# Patient Record
Sex: Male | Born: 2005 | Race: White | Hispanic: No | Smoking: Never smoker
Health system: Southern US, Community
[De-identification: ages and names within clinical notes are randomized; demographics above are authoritative.]

## PROBLEM LIST (undated history)

## (undated) DIAGNOSIS — R519 Headache, unspecified: Secondary | ICD-10-CM

## (undated) DIAGNOSIS — R51 Headache: Secondary | ICD-10-CM

## (undated) DIAGNOSIS — F909 Attention-deficit hyperactivity disorder, unspecified type: Secondary | ICD-10-CM

## (undated) DIAGNOSIS — F952 Tourette's disorder: Secondary | ICD-10-CM

## (undated) DIAGNOSIS — Z915 Personal history of self-harm: Secondary | ICD-10-CM

## (undated) HISTORY — PX: NO PAST SURGERIES: SHX2092

---
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
## 2023-01-02 ENCOUNTER — Telehealth: Payer: Self-pay | Admitting: Adult Health

## 2023-01-02 NOTE — Telephone Encounter (Signed)
Sheila lvm to inform office on directions received for the Pa. See telephone encounter 5/31 concerning PA conversation.  Contact # [phone]

## 2023-01-02 NOTE — Telephone Encounter (Signed)
Mom said insurance is asking for a letter of medical necessity. She said she brought the letter she received with meds noted he had tried in the past about 2 weeks ago. I'm think this is related to Auvelity. I know PA was denied once.

## 2023-01-03 ENCOUNTER — Telehealth: Payer: Self-pay

## 2023-01-03 DIAGNOSIS — F3341 Major depressive disorder, recurrent, in partial remission: Secondary | ICD-10-CM

## 2023-01-03 NOTE — Telephone Encounter (Signed)
The pt's PA was denied but he can still get Auvelity as long as they are using the co-pay card.

## 2023-01-03 NOTE — Telephone Encounter (Signed)
Noted  

## 2023-01-03 NOTE — Telephone Encounter (Signed)
Pt's prior authorization for Auvelity was denied twice by [facility], but he still has access to the medication as long as Mom is using the co-pay card.   Denial is stating he has to try preferred drugs. 3 in a class with 3 or more alternatives, 2 in a class with 2 alternatives or 1 in a class with only 1 alternative.  Preferred drugs are: Bupropion XL, Citalopram, desvenlafaxine xr, duloxetine, escitalopram, fluoxetine, paroxetine, sertraline, venlafaxine, Trintellix.   Contacted pt's pharmacy and pt picked up his Rx for $10.00 for one month. That was just with insurance. Pharmacist reports Mom has not brought in a co-pay savings card that she can see and asked if she could bring it in to run it. Informed her I would remind Mom to take in the savings card.   I contacted Mom, Velna Hatchet and explained the savings card, I pulled It up on line and gave her the number to text to get her digital card. Instructed her to call me back with any issues.

## 2023-01-06 NOTE — Telephone Encounter (Signed)
Pt's mom LVM @ 10:06a.  She said she's been working with the pharmacy and still having issues getting the Auvelity, even with the saving card. They need the PA sent for it to show the other meds he has tried and provide medical necessity.  The Insurance phone number is [phone].  She says they have samples.  Next appt 6/20

## 2023-01-07 NOTE — Telephone Encounter (Signed)
Noted PA has been denied but pt still has access to medication using a savings card with Auvelity. Mom is notified.

## 2023-01-16 NOTE — Telephone Encounter (Signed)
Please see the multiple messages. If I need to do anything please let me know.

## 2023-01-16 NOTE — Telephone Encounter (Signed)
Pt's mom called  @11 :22a.  She is NOT on DPR.  System is note to get DPR signed for her at next appt on 6/20.  She is asking if Almira Coaster can write a letter of medical necessity versus the PA for the Auvelity.  The insurance company told her to call [phone] for quicker response.

## 2023-01-17 NOTE — Telephone Encounter (Signed)
Per pharmaceutical rep after discussion yesterday, the pharmacy is still not using the correct coding for pt to get medication with the override. Even though pt has received 2 denials on Auvelity, medication is still accessible per savings card. I have contacted pharmacy twice already. Rx's are recommended to be sent to either [facility] or [facility]. Originally Rx sent to [facility] but Mom wanted transferred to their [employer].   If pharmacies not changed or correct override put in at his pharmacy then provider will need to try to submit an appeal and a letter for medical necessity. Although pt has tried prior medications he still does not meet requirements per his insurance provider.

## 2023-01-17 NOTE — Telephone Encounter (Signed)
Let's see if mom will switch pharmacies.

## 2023-01-18 MED ORDER — AUVELITY 45-105 MG PO TBCR: 1.0000 | EXTENDED_RELEASE_TABLET | Freq: Two times a day (BID) | ORAL | 2 refills | Status: DC

## 2023-01-18 NOTE — Telephone Encounter (Signed)
Thank you. Hopefully this will fix the issue

## 2023-01-18 NOTE — Telephone Encounter (Signed)
Mom agreeable to send Rx to [facility] or [facility].

## 2023-01-18 NOTE — Telephone Encounter (Signed)
Rx sent to [facility].

## 2023-01-19 ENCOUNTER — Ambulatory Visit (INDEPENDENT_AMBULATORY_CARE_PROVIDER_SITE_OTHER): Payer: Commercial Managed Care - PPO | Admitting: Adult Health

## 2023-01-19 ENCOUNTER — Encounter: Payer: Self-pay | Admitting: Adult Health

## 2023-01-19 DIAGNOSIS — G4700 Insomnia, unspecified: Secondary | ICD-10-CM

## 2023-01-19 DIAGNOSIS — F3341 Major depressive disorder, recurrent, in partial remission: Secondary | ICD-10-CM

## 2023-01-19 DIAGNOSIS — F411 Generalized anxiety disorder: Secondary | ICD-10-CM

## 2023-01-19 DIAGNOSIS — F902 Attention-deficit hyperactivity disorder, combined type: Secondary | ICD-10-CM

## 2023-01-19 DIAGNOSIS — F22 Delusional disorders: Secondary | ICD-10-CM | POA: Diagnosis not present

## 2023-01-19 MED ORDER — PROPRANOLOL HCL 10 MG PO TABS: 10.0000 mg | ORAL_TABLET | Freq: Three times a day (TID) | ORAL | 2 refills | Status: DC

## 2023-01-19 NOTE — Progress Notes (Signed)
Paul Bowers [id number] [birth year] 16 y.o.  Subjective:   Patient ID:  Paul Bowers is a 17 y.o. (DOB 06/23/2006) male.  Chief Complaint: No chief complaint on file.   HPI Paul Bowers presents to the office today for follow-up of ADHD, GAD, insomnia, paranoia, and Tourette's.  Describes mood today as "ok". Pleasant. Mood symptoms - reports decreased depression - "a normal amount". Reports anxiety - more s in public - feels like he is spiraling. Denies irritability. Reports worry, rumination, and over thinking. Reports obsessive thoughts, not acts. Reports concerns about the thoughts he's having. Mood has improved. Stating "I'm feel better overall". Feels like current medications are helpful. Improved interest and motivation. Taking medications as prescribed.  Energy levels stable. Active, does not have a regular exercise routine.  Enjoys some usual interests and activities. Single. Not dating. Lives with mother and father - has 4 siblings - 2 french bull dogs - [place] and [place]. Spending time with family.  Appetite adequate. Weight stable - 150 to 155 pounds - 68". Sleep has improved. Averages 8 to 9 hours. Focus and concentration stable. Completing tasks. Managing aspects of household. Plans to return to school in August - [employer] or [employer] in August. 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  Reports AH or VH - radio chatter - comes and goes.  Reports paranoia - seeing a "few" black Cadillacs following him, but things are better.  Denies self harm. Denies recent substance use.     PHQ2-9    Flowsheet Row Office Visit from 01/25/2019 in [facility]  PHQ-2 Total Score 1  PHQ-9 Total Score 4        Review of Systems:  Review of Systems  Musculoskeletal:  Negative for gait problem.  Neurological:  Negative for tremors.  Psychiatric/Behavioral:         Please refer to HPI    Medications: I have reviewed the patient's current  medications.  Current Outpatient Medications  Medication Sig Dispense Refill   propranolol (INDERAL) 10 MG tablet Take 1 tablet (10 mg total) by mouth 3 (three) times daily. 90 tablet 2   ARIPiprazole (ABILIFY) 10 MG tablet Take 1 tablet (10 mg total) by mouth daily. 30 tablet 2   Dextromethorphan-buPROPion ER (AUVELITY) 45-105 MG TBCR Take 1 tablet by mouth 2 (two) times daily. 60 tablet 2   gabapentin (NEURONTIN) 100 MG capsule Take 100 mg by mouth 3 (three) times daily as needed.     guanFACINE (INTUNIV) 1 MG TB24 ER tablet Take 1 tablet (1 mg total) by mouth daily. 30 tablet 2   No current facility-administered medications for this visit.    Medication Side Effects: None  Allergies: No Known Allergies  Past Medical History:  Diagnosis Date   ADHD    H/O self-harm 10/07/2016   Headache    Tourette's     Past Medical History, Surgical history, Social history, and Family history were reviewed and updated as appropriate.   Please see review of systems for further details on the patient's review from today.   Objective:   Physical Exam:  There were no vitals taken for this visit.  Physical Exam Constitutional:      General: He is not in acute distress. Musculoskeletal:        General: No deformity.  Neurological:     Mental Status: He is alert and oriented to person, place, and time.     Coordination: Coordination normal.  Psychiatric:  Attention and Perception: Attention and perception normal. He does not perceive auditory or visual hallucinations.        Mood and Affect: Mood normal. Mood is not anxious or depressed. Affect is not labile, blunt, angry or inappropriate.        Speech: Speech normal.        Behavior: Behavior normal.        Thought Content: Thought content normal. Thought content is not paranoid or delusional. Thought content does not include homicidal or suicidal ideation. Thought content does not include homicidal or suicidal plan.         Cognition and Memory: Cognition and memory normal.        Judgment: Judgment normal.     Comments: Insight intact     Lab Review:     Component Value Date/Time   NA 136 05/29/2017 1507   K 3.6 05/29/2017 1507   CL 100 (L) 05/29/2017 1507   CO2 25 05/29/2017 1507   GLUCOSE 100 (H) 05/29/2017 1507   BUN 16 05/29/2017 1507   CREATININE 0.78 05/29/2017 1507   CALCIUM 9.7 05/29/2017 1507   PROT 8.7 (H) 05/29/2017 1507   ALBUMIN 4.9 05/29/2017 1507   AST 60 (H) 05/29/2017 1507   ALT 77 (H) 05/29/2017 1507   ALKPHOS 228 05/29/2017 1507   BILITOT 0.4 05/29/2017 1507   GFRNONAA NOT CALCULATED 05/29/2017 1507   GFRAA NOT CALCULATED 05/29/2017 1507       Component Value Date/Time   WBC 8.8 05/29/2017 1507   RBC 5.27 05/29/2017 1507   HGB 14.6 05/29/2017 1507   HCT 43.5 05/29/2017 1507   PLT 274 05/29/2017 1507   MCV 82.5 05/29/2017 1507   MCH 27.6 05/29/2017 1507   MCHC 33.5 05/29/2017 1507   RDW 12.2 05/29/2017 1507   LYMPHSABS 2.3 06/28/2016 1240   MONOABS 0.6 06/28/2016 1240   EOSABS 0.0 06/28/2016 1240   BASOSABS 0.0 06/28/2016 1240    No results found for: "POCLITH", "LITHIUM"   No results found for: "PHENYTOIN", "PHENOBARB", "VALPROATE", "CBMZ"   .res Assessment: Plan:    Plan:  PDMP reviewed  Abilify 10mg  daily Trazadone 50mg  at hs  Gabapentin 100mg  - 1 to 2 times a day Guanfacine ER 1mg  at hs Auvelity 75mg  BID   Add Propranolol 10mg  TID  RTC 4 weeks.  135/74/89  Seeing therapist - Marisa Cyphers - [facility].  Patient advised to contact office with any questions, adverse effects, or acute worsening in signs and symptoms.  Discussed potential metabolic side effects associated with atypical antipsychotics, as well as potential risk for movement side effects. Advised pt to contact office if movement side effects occur.    Diagnoses and all orders for this visit:  Generalized anxiety disorder -     propranolol (INDERAL) 10 MG tablet; Take 1 tablet  (10 mg total) by mouth 3 (three) times daily.     Please see After Visit Summary for patient specific instructions.  No future appointments.  No orders of the defined types were placed in this encounter.   -------------------------------

## 2023-01-29 ENCOUNTER — Other Ambulatory Visit: Payer: Self-pay | Admitting: Adult Health

## 2023-01-29 DIAGNOSIS — F902 Attention-deficit hyperactivity disorder, combined type: Secondary | ICD-10-CM

## 2023-02-16 ENCOUNTER — Ambulatory Visit (INDEPENDENT_AMBULATORY_CARE_PROVIDER_SITE_OTHER): Payer: Self-pay | Admitting: Adult Health

## 2023-02-16 DIAGNOSIS — Z0389 Encounter for observation for other suspected diseases and conditions ruled out: Secondary | ICD-10-CM

## 2023-02-16 NOTE — Progress Notes (Signed)
Patient no show appointment. ? ?

## 2023-02-16 NOTE — Progress Notes (Deleted)
Paul Bowers [id number] May 09, 2006 17 y.o.  Subjective:   Patient ID:  Paul Bowers is a 17 y.o. (DOB [birth year]) male.  Chief Complaint: No chief complaint on file.   HPI Paul Bowers presents to the office today for follow-up of ***   PHQ2-9    Flowsheet Row Office Visit from 01/25/2019 in [facility]  PHQ-2 Total Score 1  PHQ-9 Total Score 4        Review of Systems:  Review of Systems  Medications: {medication reviewed/display:3041432}  Current Outpatient Medications  Medication Sig Dispense Refill   guanFACINE (INTUNIV) 1 MG TB24 ER tablet TAKE 1 TABLET(1 MG) BY MOUTH DAILY 30 tablet 0   ARIPiprazole (ABILIFY) 10 MG tablet Take 1 tablet (10 mg total) by mouth daily. 30 tablet 2   Dextromethorphan-buPROPion ER (AUVELITY) 45-105 MG TBCR Take 1 tablet by mouth 2 (two) times daily. 60 tablet 2   gabapentin (NEURONTIN) 100 MG capsule Take 100 mg by mouth 3 (three) times daily as needed.     propranolol (INDERAL) 10 MG tablet Take 1 tablet (10 mg total) by mouth 3 (three) times daily. 90 tablet 2   No current facility-administered medications for this visit.    Medication Side Effects: {Medication Side Effects (Optional):21014029}  Allergies: No Known Allergies  Past Medical History:  Diagnosis Date   ADHD    H/O self-harm 10/07/2016   Headache    Tourette's     Past Medical History, Surgical history, Social history, and Family history were reviewed and updated as appropriate.   Please see review of systems for further details on the patient's review from today.   Objective:   Physical Exam:  There were no vitals taken for this visit.  Physical Exam  Lab Review:     Component Value Date/Time   NA 136 05/29/2017 1507   K 3.6 05/29/2017 1507   CL 100 (L) 05/29/2017 1507   CO2 25 05/29/2017 1507   GLUCOSE 100 (H) 05/29/2017 1507   BUN 16 05/29/2017 1507   CREATININE 0.78 05/29/2017 1507   CALCIUM 9.7  05/29/2017 1507   PROT 8.7 (H) 05/29/2017 1507   ALBUMIN 4.9 05/29/2017 1507   AST 60 (H) 05/29/2017 1507   ALT 77 (H) 05/29/2017 1507   ALKPHOS 228 05/29/2017 1507   BILITOT 0.4 05/29/2017 1507   GFRNONAA NOT CALCULATED 05/29/2017 1507   GFRAA NOT CALCULATED 05/29/2017 1507       Component Value Date/Time   WBC 8.8 05/29/2017 1507   RBC 5.27 05/29/2017 1507   HGB 14.6 05/29/2017 1507   HCT 43.5 05/29/2017 1507   PLT 274 05/29/2017 1507   MCV 82.5 05/29/2017 1507   MCH 27.6 05/29/2017 1507   MCHC 33.5 05/29/2017 1507   RDW 12.2 05/29/2017 1507   LYMPHSABS 2.3 06/28/2016 1240   MONOABS 0.6 06/28/2016 1240   EOSABS 0.0 06/28/2016 1240   BASOSABS 0.0 06/28/2016 1240    No results found for: "POCLITH", "LITHIUM"   No results found for: "PHENYTOIN", "PHENOBARB", "VALPROATE", "CBMZ"   .res Assessment: Plan:    There are no diagnoses linked to this encounter.   Please see After Visit Summary for patient specific instructions.  Future Appointments  Date Time Provider Department Center  02/16/2023 10:40 AM Weslynn Ke, Thereasa Solo, NP [facility] None    No orders of the defined types were placed in this encounter.   -------------------------------

## 2023-02-20 ENCOUNTER — Encounter: Payer: Self-pay | Admitting: Adult Health

## 2023-02-20 ENCOUNTER — Ambulatory Visit (INDEPENDENT_AMBULATORY_CARE_PROVIDER_SITE_OTHER): Payer: Commercial Managed Care - PPO | Admitting: Adult Health

## 2023-02-20 DIAGNOSIS — G4700 Insomnia, unspecified: Secondary | ICD-10-CM | POA: Diagnosis not present

## 2023-02-20 DIAGNOSIS — F3341 Major depressive disorder, recurrent, in partial remission: Secondary | ICD-10-CM | POA: Diagnosis not present

## 2023-02-20 DIAGNOSIS — F411 Generalized anxiety disorder: Secondary | ICD-10-CM | POA: Diagnosis not present

## 2023-02-20 DIAGNOSIS — F902 Attention-deficit hyperactivity disorder, combined type: Secondary | ICD-10-CM | POA: Diagnosis not present

## 2023-02-20 MED ORDER — AUVELITY 45-105 MG PO TBCR
1.0000 | EXTENDED_RELEASE_TABLET | Freq: Two times a day (BID) | ORAL | 2 refills | Status: AC
Start: 2023-02-20 — End: ?

## 2023-02-20 MED ORDER — TRAZODONE HCL 50 MG PO TABS
50.0000 mg | ORAL_TABLET | Freq: Every day | ORAL | 2 refills | Status: AC
Start: 2023-02-20 — End: ?

## 2023-02-20 MED ORDER — PROPRANOLOL HCL 20 MG PO TABS
20.0000 mg | ORAL_TABLET | Freq: Three times a day (TID) | ORAL | 2 refills | Status: AC
Start: 2023-02-20 — End: ?

## 2023-02-20 MED ORDER — ARIPIPRAZOLE 10 MG PO TABS
10.0000 mg | ORAL_TABLET | Freq: Every day | ORAL | 2 refills | Status: AC
Start: 2023-02-20 — End: ?

## 2023-02-20 NOTE — Progress Notes (Signed)
Paul Bowers [id number] Nov 08, 2005 17 y.o.  Subjective:   Patient ID:  KEEDAN SAMPLE is a 17 y.o. (DOB January 21, 2006) male.  Chief Complaint: No chief complaint on file.   HPI Sire Poet Willcox presents to the office today for follow-up of ADHD, GAD, insomnia, paranoia, and Tourette's.  Describes mood today as "ok". Pleasant. Mood symptoms - reports decreased depression - "a little bit". Reports anxiety - "still having some". Reports social anxiety. Denies irritability. Reports some worry, rumination, and over thinking. Reports decreased obsessive thoughts, denies acts. Reports some paranoia - "not as bad". Mood has improved. Stating "I'm feel better". Feels like current medications are helpful. Improved interest and motivation. Taking medications as prescribed.  Energy levels stable. Active, does not have a regular exercise routine.  Enjoys some usual interests and activities. Single. Not dating. Lives with mother and father - has 4 siblings - 2 boston terriers - [place] and [place]. Spending time with family.  Appetite adequate. Weight stable - 150 to 155 pounds - 68". Sleep has improved. Averages 8 to 9 hours. Focus and concentration stable. Completing tasks. Managing aspects of household. Plans to return to school in August - [employer] in August. Working 2 jobs.  Denies SI or HI.  Reports AH or VH - radio chatter - comes and goes.  Reports paranoia. Denies self harm. Denies recent substance use.   PHQ2-9    Flowsheet Row Office Visit from 01/25/2019 in [facility]  PHQ-2 Total Score 1  PHQ-9 Total Score 4        Review of Systems:  Review of Systems  Musculoskeletal:  Negative for gait problem.  Neurological:  Negative for tremors.  Psychiatric/Behavioral:         Please refer to HPI    Medications: I have reviewed the patient's current medications.  Current Outpatient Medications  Medication Sig Dispense Refill   traZODone (DESYREL) 50  MG tablet Take 1 tablet (50 mg total) by mouth at bedtime. 30 tablet 2   ARIPiprazole (ABILIFY) 10 MG tablet Take 1 tablet (10 mg total) by mouth daily. 30 tablet 2   Dextromethorphan-buPROPion ER (AUVELITY) 45-105 MG TBCR Take 1 tablet by mouth 2 (two) times daily. 60 tablet 2   propranolol (INDERAL) 20 MG tablet Take 1 tablet (20 mg total) by mouth 3 (three) times daily. 90 tablet 2   No current facility-administered medications for this visit.    Medication Side Effects: None  Allergies: No Known Allergies  Past Medical History:  Diagnosis Date   ADHD    H/O self-harm 10/07/2016   Headache    Tourette's     Past Medical History, Surgical history, Social history, and Family history were reviewed and updated as appropriate.   Please see review of systems for further details on the patient's review from today.   Objective:   Physical Exam:  There were no vitals taken for this visit.  Physical Exam Constitutional:      General: He is not in acute distress. Musculoskeletal:        General: No deformity.  Neurological:     Mental Status: He is alert and oriented to person, place, and time.     Coordination: Coordination normal.  Psychiatric:        Attention and Perception: Attention and perception normal. He does not perceive auditory or visual hallucinations.        Mood and Affect: Affect is not labile, blunt, angry or inappropriate.  Speech: Speech normal.        Behavior: Behavior normal.        Thought Content: Thought content normal. Thought content is not paranoid or delusional. Thought content does not include homicidal or suicidal ideation. Thought content does not include homicidal or suicidal plan.        Cognition and Memory: Cognition and memory normal.        Judgment: Judgment normal.     Comments: Insight intact     Lab Review:     Component Value Date/Time   NA 136 05/29/2017 1507   K 3.6 05/29/2017 1507   CL 100 (L) 05/29/2017 1507   CO2 25  05/29/2017 1507   GLUCOSE 100 (H) 05/29/2017 1507   BUN 16 05/29/2017 1507   CREATININE 0.78 05/29/2017 1507   CALCIUM 9.7 05/29/2017 1507   PROT 8.7 (H) 05/29/2017 1507   ALBUMIN 4.9 05/29/2017 1507   AST 60 (H) 05/29/2017 1507   ALT 77 (H) 05/29/2017 1507   ALKPHOS 228 05/29/2017 1507   BILITOT 0.4 05/29/2017 1507   GFRNONAA NOT CALCULATED 05/29/2017 1507   GFRAA NOT CALCULATED 05/29/2017 1507       Component Value Date/Time   WBC 8.8 05/29/2017 1507   RBC 5.27 05/29/2017 1507   HGB 14.6 05/29/2017 1507   HCT 43.5 05/29/2017 1507   PLT 274 05/29/2017 1507   MCV 82.5 05/29/2017 1507   MCH 27.6 05/29/2017 1507   MCHC 33.5 05/29/2017 1507   RDW 12.2 05/29/2017 1507   LYMPHSABS 2.3 06/28/2016 1240   MONOABS 0.6 06/28/2016 1240   EOSABS 0.0 06/28/2016 1240   BASOSABS 0.0 06/28/2016 1240    No results found for: "POCLITH", "LITHIUM"   No results found for: "PHENYTOIN", "PHENOBARB", "VALPROATE", "CBMZ"   .res Assessment: Plan:    Plan:  PDMP reviewed  Abilify 10mg  daily Trazadone 50mg  at hs  Auvelity 75mg  BID  Increase Propranolol 10mg  to 20mg  TID  D/C Guanfacine ER 1mg  at hs  RTC 4 weeks. 88/46/94  Seeing therapist - Marisa Cyphers - [facility].  Patient advised to contact office with any questions, adverse effects, or acute worsening in signs and symptoms.  Discussed potential metabolic side effects associated with atypical antipsychotics, as well as potential risk for movement side effects. Advised pt to contact office if movement side effects occur.    Diagnoses and all orders for this visit:  Insomnia, unspecified type -     traZODone (DESYREL) 50 MG tablet; Take 1 tablet (50 mg total) by mouth at bedtime. -     propranolol (INDERAL) 20 MG tablet; Take 1 tablet (20 mg total) by mouth 3 (three) times daily.  Depression, major, recurrent, in partial remission (HCC) -     ARIPiprazole (ABILIFY) 10 MG tablet; Take 1 tablet (10 mg total) by mouth daily. -      Dextromethorphan-buPROPion ER (AUVELITY) 45-105 MG TBCR; Take 1 tablet by mouth 2 (two) times daily.  Attention deficit hyperactivity disorder, combined type, moderate  Generalized anxiety disorder -     propranolol (INDERAL) 20 MG tablet; Take 1 tablet (20 mg total) by mouth 3 (three) times daily.     Please see After Visit Summary for patient specific instructions.  No future appointments.   No orders of the defined types were placed in this encounter.   -------------------------------

## 2023-02-21 ENCOUNTER — Telehealth: Payer: Self-pay | Admitting: Adult Health

## 2023-02-21 NOTE — Telephone Encounter (Signed)
Pharmacy sent Pa Request for Auvelity 45-105mg  , see CMM

## 2023-02-22 NOTE — Telephone Encounter (Signed)
disregard

## 2023-03-03 ENCOUNTER — Emergency Department (HOSPITAL_COMMUNITY)
Admission: EM | Admit: 2023-03-03 | Discharge: 2023-03-04 | Disposition: A | Discharge status: Other Acute Inpatient Hospital | Payer: Commercial Managed Care - PPO | Source: Home / Self Care | Attending: Emergency Medicine | Admitting: Emergency Medicine

## 2023-03-03 ENCOUNTER — Other Ambulatory Visit: Payer: Self-pay

## 2023-03-03 ENCOUNTER — Encounter: Payer: Self-pay | Admitting: Emergency Medicine

## 2023-03-03 DIAGNOSIS — F902 Attention-deficit hyperactivity disorder, combined type: Secondary | ICD-10-CM | POA: Insufficient documentation

## 2023-03-03 DIAGNOSIS — R799 Abnormal finding of blood chemistry, unspecified: Secondary | ICD-10-CM | POA: Insufficient documentation

## 2023-03-03 DIAGNOSIS — T484X2A Poisoning by expectorants, intentional self-harm, initial encounter: Secondary | ICD-10-CM | POA: Insufficient documentation

## 2023-03-03 DIAGNOSIS — X838XXA Intentional self-harm by other specified means, initial encounter: Secondary | ICD-10-CM | POA: Insufficient documentation

## 2023-03-03 DIAGNOSIS — F900 Attention-deficit hyperactivity disorder, predominantly inattentive type: Secondary | ICD-10-CM | POA: Diagnosis not present

## 2023-03-03 DIAGNOSIS — F332 Major depressive disorder, recurrent severe without psychotic features: Secondary | ICD-10-CM | POA: Diagnosis not present

## 2023-03-03 DIAGNOSIS — R45851 Suicidal ideations: Secondary | ICD-10-CM

## 2023-03-03 DIAGNOSIS — T50902D Poisoning by unspecified drugs, medicaments and biological substances, intentional self-harm, subsequent encounter: Secondary | ICD-10-CM | POA: Diagnosis not present

## 2023-03-03 DIAGNOSIS — F422 Mixed obsessional thoughts and acts: Secondary | ICD-10-CM | POA: Diagnosis present

## 2023-03-03 DIAGNOSIS — T50902A Poisoning by unspecified drugs, medicaments and biological substances, intentional self-harm, initial encounter: Secondary | ICD-10-CM | POA: Insufficient documentation

## 2023-03-03 DIAGNOSIS — T50904A Poisoning by unspecified drugs, medicaments and biological substances, undetermined, initial encounter: Secondary | ICD-10-CM

## 2023-03-03 LAB — URINE DRUG SCREEN, QUALITATIVE (ARMC ONLY)
Amphetamines, Ur Screen: NOT DETECTED
Barbiturates, Ur Screen: NOT DETECTED
Benzodiazepine, Ur Scrn: NOT DETECTED
Cannabinoid 50 Ng, Ur ~~LOC~~: POSITIVE — AB
Cocaine Metabolite,Ur ~~LOC~~: NOT DETECTED
MDMA (Ecstasy)Ur Screen: NOT DETECTED
Methadone Scn, Ur: NOT DETECTED
Opiate, Ur Screen: POSITIVE — AB
Phencyclidine (PCP) Ur S: NOT DETECTED
Tricyclic, Ur Screen: POSITIVE — AB

## 2023-03-03 LAB — CBC
HCT: 45 % (ref 39.0–52.0)
Hemoglobin: 15.2 g/dL (ref 13.0–17.0)
MCH: 29.5 pg (ref 26.0–34.0)
MCHC: 33.8 g/dL (ref 30.0–36.0)
MCV: 87.4 fL (ref 80.0–100.0)
Platelets: 303 10*3/uL (ref 150–400)
RBC: 5.15 MIL/uL (ref 4.22–5.81)
RDW: 11.9 % (ref 11.5–15.5)
WBC: 8.6 10*3/uL (ref 4.0–10.5)
nRBC: 0 % (ref 0.0–0.2)

## 2023-03-03 LAB — COMPREHENSIVE METABOLIC PANEL
ALT: 29 U/L (ref 0–44)
AST: 26 U/L (ref 15–41)
Albumin: 4.8 g/dL (ref 3.5–5.0)
Alkaline Phosphatase: 117 U/L (ref 38–126)
Anion gap: 11 (ref 5–15)
BUN: 10 mg/dL (ref 6–20)
CO2: 25 mmol/L (ref 22–32)
Calcium: 9.3 mg/dL (ref 8.9–10.3)
Chloride: 102 mmol/L (ref 98–111)
Creatinine, Ser: 1.17 mg/dL (ref 0.61–1.24)
GFR, Estimated: >60 mL/min (ref >60)
Glucose, Bld: 71 mg/dL (ref 70–99)
Potassium: 3.8 mmol/L (ref 3.5–5.1)
Sodium: 138 mmol/L (ref 135–145)
Total Bilirubin: 1.1 mg/dL (ref 0.3–1.2)
Total Protein: 8.2 g/dL — ABNORMAL HIGH (ref 6.5–8.1)

## 2023-03-03 LAB — SALICYLATE LEVEL: Salicylate Lvl: <7 mg/dL — ABNORMAL LOW (ref 7.0–30.0)

## 2023-03-03 LAB — CBG MONITORING, ED: Glucose-Capillary: 73 mg/dL (ref 70–99)

## 2023-03-03 LAB — ACETAMINOPHEN LEVEL: Acetaminophen (Tylenol), Serum: <10 ug/mL — ABNORMAL LOW (ref 10–30)

## 2023-03-03 LAB — ETHANOL: Alcohol, Ethyl (B): <10 mg/dL (ref <10)

## 2023-03-03 MED ORDER — SODIUM CHLORIDE 0.9 % IV BOLUS
2000.0000 mL | Freq: Once | INTRAVENOUS | Status: AC
  Administered 2023-03-03: 2000 mL via INTRAVENOUS

## 2023-03-03 NOTE — ED Notes (Signed)
IVC 

## 2023-03-03 NOTE — ED Notes (Addendum)
Patient suspected to have taken 12 mucinex and potentially 29 25 mg benadryl tabs. Benadryl bottle dispensed with 100 tabs and 29 are missing. Unknown what amount pt actually consumed. Patient placed on cardiac monitor upon arrival in room and EKG obtained. Pt noted to be responding to internal stimuli in the form of visual hallucinations. Pt notable confused and unsteady gait.   Poison control contacted and recommendations as follows:   Monitor for delirium, combativeness, urinary retention. Monitor QRS and QTC routinely and repeat EKG in 4 hours. Draw a tylenol level now and repeat in 4 hours. Administer IVF and Benzo's as needed. Minimum observation time of 6 hours on cardiac monitor.  RN spoke with [employer]

## 2023-03-03 NOTE — ED Triage Notes (Signed)
  Patient BIB parents after intentional OD and suicidal attempt around 1700 today.  Parents state patient took 14 mucinex, unknown amount of benadryl, and used his THC vape.  Patient has hx of same last October.  Patient observed by family doing odd things and empty pill bottles in bedroom.  Parents state patient may have been trying to get high instead of harm himself.

## 2023-03-04 ENCOUNTER — Encounter: Payer: Self-pay | Admitting: Psychiatry

## 2023-03-04 ENCOUNTER — Encounter: Payer: Self-pay | Admitting: Psychiatric/Mental Health

## 2023-03-04 ENCOUNTER — Inpatient Hospital Stay
Admission: EM | Admit: 2023-03-04 | Discharge: 2023-03-07 | DRG: 885 | Disposition: A | Discharge status: Home / Self Care | Payer: Commercial Managed Care - PPO | Source: Intra-hospital | Attending: Psychiatry | Admitting: Psychiatry

## 2023-03-04 ENCOUNTER — Other Ambulatory Visit: Payer: Self-pay

## 2023-03-04 DIAGNOSIS — T484X2A Poisoning by expectorants, intentional self-harm, initial encounter: Secondary | ICD-10-CM | POA: Diagnosis present

## 2023-03-04 DIAGNOSIS — F900 Attention-deficit hyperactivity disorder, predominantly inattentive type: Secondary | ICD-10-CM | POA: Diagnosis not present

## 2023-03-04 DIAGNOSIS — F332 Major depressive disorder, recurrent severe without psychotic features: Secondary | ICD-10-CM

## 2023-03-04 DIAGNOSIS — T50902A Poisoning by unspecified drugs, medicaments and biological substances, intentional self-harm, initial encounter: Secondary | ICD-10-CM | POA: Insufficient documentation

## 2023-03-04 DIAGNOSIS — T50902D Poisoning by unspecified drugs, medicaments and biological substances, intentional self-harm, subsequent encounter: Secondary | ICD-10-CM | POA: Diagnosis not present

## 2023-03-04 DIAGNOSIS — R799 Abnormal finding of blood chemistry, unspecified: Secondary | ICD-10-CM | POA: Diagnosis present

## 2023-03-04 DIAGNOSIS — F909 Attention-deficit hyperactivity disorder, unspecified type: Secondary | ICD-10-CM | POA: Diagnosis present

## 2023-03-04 DIAGNOSIS — T450X2A Poisoning by antiallergic and antiemetic drugs, intentional self-harm, initial encounter: Secondary | ICD-10-CM | POA: Diagnosis present

## 2023-03-04 DIAGNOSIS — R45851 Suicidal ideations: Secondary | ICD-10-CM | POA: Diagnosis present

## 2023-03-04 DIAGNOSIS — F902 Attention-deficit hyperactivity disorder, combined type: Secondary | ICD-10-CM | POA: Diagnosis present

## 2023-03-04 DIAGNOSIS — Z818 Family history of other mental and behavioral disorders: Secondary | ICD-10-CM

## 2023-03-04 DIAGNOSIS — F952 Tourette's disorder: Secondary | ICD-10-CM | POA: Diagnosis present

## 2023-03-04 MED ORDER — HYDROXYZINE HCL 25 MG PO TABS
25.0000 mg | ORAL_TABLET | Freq: Three times a day (TID) | ORAL | Status: DC | PRN
  Filled 2023-03-04: qty 1

## 2023-03-04 MED ORDER — ARIPIPRAZOLE 10 MG PO TABS: 10.0000 mg | ORAL_TABLET | Freq: Every day | ORAL | Status: DC

## 2023-03-04 MED ORDER — TRAZODONE HCL 50 MG PO TABS: 50.0000 mg | ORAL_TABLET | Freq: Every day | ORAL | Status: DC

## 2023-03-04 MED ORDER — ALUM & MAG HYDROXIDE-SIMETH 200-200-20 MG/5ML PO SUSP: 30.0000 mL | ORAL | Status: DC | PRN

## 2023-03-04 MED ORDER — LORAZEPAM 2 MG PO TABS: 2.0000 mg | ORAL_TABLET | Freq: Three times a day (TID) | ORAL | Status: DC | PRN

## 2023-03-04 MED ORDER — HALOPERIDOL LACTATE 5 MG/ML IJ SOLN: 5.0000 mg | Freq: Three times a day (TID) | INTRAMUSCULAR | Status: DC | PRN

## 2023-03-04 MED ORDER — PROPRANOLOL HCL 20 MG PO TABS: 20.0000 mg | ORAL_TABLET | Freq: Three times a day (TID) | ORAL | Status: DC

## 2023-03-04 MED ORDER — DIPHENHYDRAMINE HCL 50 MG/ML IJ SOLN: 50.0000 mg | Freq: Three times a day (TID) | INTRAMUSCULAR | Status: DC | PRN

## 2023-03-04 MED ORDER — TRAZODONE HCL 50 MG PO TABS
50.0000 mg | ORAL_TABLET | Freq: Every evening | ORAL | Status: DC | PRN
  Administered 2023-03-04 – 2023-03-06 (×3): 50 mg via ORAL
  Filled 2023-03-04 (×3): qty 1

## 2023-03-04 MED ORDER — DEXTROMETHORPHAN-BUPROPION ER 45-105 MG PO TBCR: 1.0000 | EXTENDED_RELEASE_TABLET | Freq: Two times a day (BID) | ORAL | Status: DC

## 2023-03-04 MED ORDER — MAGNESIUM HYDROXIDE 400 MG/5ML PO SUSP: 30.0000 mL | Freq: Every day | ORAL | Status: DC | PRN

## 2023-03-04 MED ORDER — ACETAMINOPHEN 325 MG PO TABS: 650.0000 mg | ORAL_TABLET | Freq: Four times a day (QID) | ORAL | Status: DC | PRN

## 2023-03-04 MED ORDER — LORAZEPAM 2 MG/ML IJ SOLN: 2.0000 mg | Freq: Three times a day (TID) | INTRAMUSCULAR | Status: DC | PRN

## 2023-03-04 MED ORDER — DIPHENHYDRAMINE HCL 25 MG PO CAPS: 50.0000 mg | ORAL_CAPSULE | Freq: Three times a day (TID) | ORAL | Status: DC | PRN

## 2023-03-04 MED ORDER — HALOPERIDOL 5 MG PO TABS: 5.0000 mg | ORAL_TABLET | Freq: Three times a day (TID) | ORAL | Status: DC | PRN

## 2023-03-04 NOTE — ED Notes (Signed)
Parents leaving now. Parents were provided with phone number and visitation hours for BMU. NP explained plan of care to pt and parents.

## 2023-03-04 NOTE — ED Notes (Signed)
Father of pt who is still at bedside past 15 minute visitation window is insisting that charge nurse promised that he would be called to come back for psych consult today. CN went and spoke with him. Mother will be escorted back so both parents can be present. Psych NP already assessed pt today but has agreed to come back to talk to parents and pt again. Father of pt and pt are stating that pt was never suicidal and should not be admitted inpatient. Informed them that this RN has no control over IVC status.

## 2023-03-04 NOTE — ED Notes (Signed)
Patient father provided recliner. No distress noted and patient calm. VSS. CB in reach. Will continue to monitor.

## 2023-03-04 NOTE — ED Notes (Signed)
Psych NP was at bedside with pt and parents.

## 2023-03-04 NOTE — Consult Note (Signed)
Telepsych Consultation   Reason for Consult:  Psych Evaluation Referring Physician:  Dr. Fanny Bien Location of Patient: [facility] Location of Provider: Other: Remote office   Patient Identification: Paul Bowers MRN:  [id number] Principal Diagnosis: Suicide attempt by drug ingestion [facility]) Diagnosis:  Principal Problem:   Suicide attempt by drug ingestion (HCC) Active Problems:   MDD (major depressive disorder), recurrent episode, severe (HCC)   Suicide ideation   Mixed obsessional thoughts and acts   Attention deficit hyperactivity disorder, combined type, moderate   Total Time spent with patient: 45 minutes  Subjective:  Patient BIB parents after intentional OD and suicidal attempt around 1700 today. Parents state patient took 14 mucinex, unknown amount of benadryl, and used his THC vape.    HPI:  Tele psych Assessment   Paul Bowers, 17 y.o., male patient with a history of ADHD, MDD, prior SA, and SI presents to [facility] ER with complaints of depression and an apparent suicide attempt seen via tele health by TTS and this provider; chart reviewed and consulted with Dr. Katrinka Blazing on 03/04/23.  On evaluation Paul Bowers reports that that he took  14 mucinex, and a handful of benadryl to "get high". However, per chart review, as early as   06/2016 when patient was 17 years old, patient was admitted to [facility] for posting on social media that he wanted to hang himself.  09/2016 patient was admitted to [facility] due to the following events :  "Pt's father reported, the pt was admitted to [facility], three months ago for suicidal thoughts and a suicide attempt. Pt reported, over the past year he has had 4-5 suicide attempts. Pt reported, for the past three weeks he has been cutting himself on the stomach with a razor. Pt reported, he tried to hang himself a couple of times. Pt reported, he "sort of" attempted suicide by taking 4-5 pills. Pt reported, "I wouldn't purposely moved out of the way  if a car was coming."  05/02/2022 patient presented to [facility] with an overdose of benadryl:  "When asked why he thought he was in ED pt states, "last night I had a shot of alcohol, but then my roommate came back, so I went to the parking deck and while I was there 2 police officers came to talk to me, then I guess they called an ambulance". ED nurses noted at triage concern pt had also used an unknown amount of Benadryl, however when asked by crisis team pt denies if he had used any Benadryl or other substances besides the one shot of liquor." Patient was observed overnight and discharged with his family.  Today patient presents for an overdose of benadryl and mucinex.  Patient and father downplays the intent of the overdose. Dad says, "you know these teenagers take a mixture of anything to get high".  When asking the patient questions, patient often loks over to dad for the answers.  Dad says he believes that most of his symptoms are related to his schizophrenia diagnosis.  Of note, there is no formal schizophrenia diagnosis per this provider's chart review.    Per TTS, Patient is a 17 year old male presenting to [facility] ED under IVC. Per triage note Patient BIB parents after intentional OD and suicidal attempt around 1700 today. Parents state patient took 14 mucinex, unknown amount of benadryl, and used his THC vape. Patient has hx of same last October. Patient observed by family doing odd things and empty pill bottles in bedroom.  Parents state patient may have been trying to get high instead of harm himself. During assessment patient appears alert and oriented x4, calm and cooperative. Patient reports "I took a bunch of pills, there was no intention to hurt myself, I just wanted to get high." Patient reports that he used to smoke marijuana and drank alcohol but hasn't had those substance since June. Patient reports that he took the medication due to "mental health issues, I have a diagnosis of Schizophrenia", he  reports that he was experiencing AH "they were telling me that I shouldn't tell people that I have Schizophrenia." Patient currently has a psychiatrist and a therapist with [facility] in [place] and patient has a diagnosis of Depression and ADHD but no documented Schizophrenia diagnosis. Patient reports that he is prescribed medications for his mental health that he takes. Patient denies SI/HI/VH.   Patient is presenting with his father Paul Bowers who also reports that the patient was using the medication to get high and reports that this was not attempt to hurt himself "there's research where kids are using Benadryl to get high." Father minimizes the actions that the patient took tonight throughout the assessment and reports that the patient "did worse than this in October." Father reports that he used more Benadryl in the past and that the patient was discharged to his parent's care after he was medically cleared. Father reports that the patient has had partial hospitalization within the past 6 months at a treatment facility in [place].    Recommendations: Psychiatric  Inpatient hospitalization  Past Psychiatric History: Past SA, MDD, ADHD  Risk to Self:  yes Risk to Others:  no Prior Inpatient Therapy:  yes Prior Outpatient Therapy:  yes  Past Medical History:  Past Medical History:  Diagnosis Date   ADHD    H/O self-harm 10/07/2016   Headache    Tourette's    History reviewed. No pertinent surgical history. Family History:  Family History  Problem Relation Age of Onset   Kidney disease Mother    Diabetes Mother    Depression Mother    Breast cancer Maternal Grandmother    Family Psychiatric  History: unknown Social History:  Social History   Substance and Sexual Activity  Alcohol Use No     Social History   Substance and Sexual Activity  Drug Use No    Social History   Socioeconomic History   Marital status: Single    Spouse name: Not on file   Number of  children: Not on file   Years of education: Not on file   Highest education level: Not on file  Occupational History   Not on file  Tobacco Use   Smoking status: Never   Smokeless tobacco: Never  Vaping Use   Vaping status: Never Used  Substance and Sexual Activity   Alcohol use: No   Drug use: No   Sexual activity: Never  Other Topics Concern   Not on file  Social History Narrative   Not on file   Social Determinants of Health   Financial Resource Strain: Not on file  Food Insecurity: Not on file  Transportation Needs: Not on file  Physical Activity: Not on file  Stress: Not on file  Social Connections: Not on file   Additional Social History:    Allergies:  No Known Allergies  Labs:  Results for orders placed or performed during the hospital encounter of 03/03/23 (from the past 48 hour(s))  Comprehensive metabolic panel  Status: Abnormal   Collection Time: 03/03/23  8:18 PM  Result Value Ref Range   Sodium 138 135 - 145 mmol/L   Potassium 3.8 3.5 - 5.1 mmol/L   Chloride 102 98 - 111 mmol/L   CO2 25 22 - 32 mmol/L   Glucose, Bld 71 70 - 99 mg/dL    Comment: Glucose reference range applies only to samples taken after fasting for at least 8 hours.   BUN 10 6 - 20 mg/dL   Creatinine, Ser 1.61 0.61 - 1.24 mg/dL   Calcium 9.3 8.9 - 09.6 mg/dL   Total Protein 8.2 (H) 6.5 - 8.1 g/dL   Albumin 4.8 3.5 - 5.0 g/dL   AST 26 15 - 41 U/L   ALT 29 0 - 44 U/L   Alkaline Phosphatase 117 38 - 126 U/L   Total Bilirubin 1.1 0.3 - 1.2 mg/dL   GFR, Estimated >04 >54 mL/min    Comment: (NOTE) Calculated using the CKD-EPI Creatinine Equation (2021)    Anion gap 11 5 - 15    Comment: Performed at [facility], [address] [place], [place] [address]  Ethanol     Status: None   Collection Time: 03/03/23  8:18 PM  Result Value Ref Range   Alcohol, Ethyl (B) <10 <10 mg/dL    Comment: (NOTE) Lowest detectable limit for serum alcohol is 10 mg/dL.  For medical  purposes only. Performed at [facility], [address] [place], [place] [address]   Salicylate level     Status: Abnormal   Collection Time: 03/03/23  8:18 PM  Result Value Ref Range   Salicylate Lvl <7.0 (L) 7.0 - 30.0 mg/dL    Comment: Performed at [facility], [address] [place], [place] [address]  Acetaminophen level     Status: Abnormal   Collection Time: 03/03/23  8:18 PM  Result Value Ref Range   Acetaminophen (Tylenol), Serum <10 (L) 10 - 30 ug/mL    Comment: (NOTE) Therapeutic concentrations vary significantly. A range of 10-30 ug/mL  may be an effective concentration for many patients. However, some  are best treated at concentrations outside of this range. Acetaminophen concentrations >150 ug/mL at 4 hours after ingestion  and >50 ug/mL at 12 hours after ingestion are often associated with  toxic reactions.  Performed at [facility], [address] [place], [place] [address]   cbc     Status: None   Collection Time: 03/03/23  8:18 PM  Result Value Ref Range   WBC 8.6 4.0 - 10.5 K/uL   RBC 5.15 4.22 - 5.81 MIL/uL   Hemoglobin 15.2 13.0 - 17.0 g/dL   HCT 57.8 46.9 - 62.9 %   MCV 87.4 80.0 - 100.0 fL   MCH 29.5 26.0 - 34.0 pg   MCHC 33.8 30.0 - 36.0 g/dL   RDW 52.8 41.3 - 24.4 %   Platelets 303 150 - 400 K/uL   nRBC 0.0 0.0 - 0.2 %    Comment: Performed at [facility], [address] [place], [place] [address]  CBG monitoring, ED     Status: None   Collection Time: 03/03/23  8:55 PM  Result Value Ref Range   Glucose-Capillary 73 70 - 99 mg/dL    Comment: Glucose reference range applies only to samples taken after fasting for at least 8 hours.  Urine Drug Screen, Qualitative     Status: Abnormal   Collection Time: 03/03/23 10:20 PM  Result  Value Ref Range   Tricyclic, Ur Screen POSITIVE (A) NONE DETECTED   Amphetamines, Ur Screen NONE DETECTED NONE DETECTED   MDMA (Ecstasy)Ur Screen NONE DETECTED NONE DETECTED    Cocaine Metabolite,Ur [place] NONE DETECTED NONE DETECTED   Opiate, Ur Screen POSITIVE (A) NONE DETECTED   Phencyclidine (PCP) Ur S NONE DETECTED NONE DETECTED   Cannabinoid 50 Ng, Ur [place] POSITIVE (A) NONE DETECTED   Barbiturates, Ur Screen NONE DETECTED NONE DETECTED   Benzodiazepine, Ur Scrn NONE DETECTED NONE DETECTED   Methadone Scn, Ur NONE DETECTED NONE DETECTED    Comment: (NOTE) Tricyclics + metabolites, urine    Cutoff 1000 ng/mL Amphetamines + metabolites, urine  Cutoff 1000 ng/mL MDMA (Ecstasy), urine              Cutoff 500 ng/mL Cocaine Metabolite, urine          Cutoff 300 ng/mL Opiate + metabolites, urine        Cutoff 300 ng/mL Phencyclidine (PCP), urine         Cutoff 25 ng/mL Cannabinoid, urine                 Cutoff 50 ng/mL Barbiturates + metabolites, urine  Cutoff 200 ng/mL Benzodiazepine, urine              Cutoff 200 ng/mL Methadone, urine                   Cutoff 300 ng/mL  The urine drug screen provides only a preliminary, unconfirmed analytical test result and should not be used for non-medical purposes. Clinical consideration and professional judgment should be applied to any positive drug screen result due to possible interfering substances. A more specific alternate chemical method must be used in order to obtain a confirmed analytical result. Gas chromatography / mass spectrometry (GC/MS) is the preferred confirm atory method. Performed at [facility], [address] [place], [place] [address]   Acetaminophen level     Status: Abnormal   Collection Time: 03/04/23  2:33 AM  Result Value Ref Range   Acetaminophen (Tylenol), Serum <10 (L) 10 - 30 ug/mL    Comment: (NOTE) Therapeutic concentrations vary significantly. A range of 10-30 ug/mL  may be an effective concentration for many patients. However, some  are best treated at concentrations outside of this range. Acetaminophen concentrations >150 ug/mL at 4 hours after ingestion  and >50  ug/mL at 12 hours after ingestion are often associated with  toxic reactions.  Performed at [facility], [address] [place], [place] [address]   Salicylate level     Status: Abnormal   Collection Time: 03/04/23  2:33 AM  Result Value Ref Range   Salicylate Lvl <7.0 (L) 7.0 - 30.0 mg/dL    Comment: Performed at [facility], [address] [place], [place] [address]    Medications:  No current facility-administered medications for this encounter.   Current Outpatient Medications  Medication Sig Dispense Refill   ARIPiprazole (ABILIFY) 10 MG tablet Take 1 tablet (10 mg total) by mouth daily. 30 tablet 2   Dextromethorphan-buPROPion ER (AUVELITY) 45-105 MG TBCR Take 1 tablet by mouth 2 (two) times daily. 60 tablet 2   propranolol (INDERAL) 20 MG tablet Take 1 tablet (20 mg total) by mouth 3 (three) times daily. 90 tablet 2   traZODone (DESYREL) 50 MG tablet Take 1 tablet (50 mg total) by mouth at bedtime. 30 tablet 2    Musculoskeletal: Strength & Muscle  Tone: within normal limits Gait & Station: normal Patient leans: N/A          Psychiatric Specialty Exam:  Presentation  General Appearance: Appropriate for Environment; Casual  Eye Contact:Fair  Speech:Clear and Coherent  Speech Volume:Decreased  Handedness:Right   Mood and Affect  Mood:Anxious; Depressed; Dysphoric; Worthless  Affect:Depressed; Flat; Restricted   Thought Process  Thought Processes:Coherent  Descriptions of Associations:Intact  Orientation:Full (Time, Place and Person)  Thought Content:WDL  History of Schizophrenia/Schizoaffective disorder:No  Duration of Psychotic Symptoms:Less than six months  Hallucinations:Hallucinations: Auditory Description of Auditory Hallucinations: drug induced  Ideas of Reference:None  Suicidal Thoughts:Suicidal Thoughts: Yes, Active SI Active Intent and/or Plan: With Plan; With Means to Carry Out; With Intent  Homicidal  Thoughts:Homicidal Thoughts: No   Sensorium  Memory:Immediate Fair; Remote Fair  Judgment:Impaired  Insight:Lacking   Executive Functions  Concentration:Fair  Attention Span:Fair  Recall:Fair  Fund of Knowledge:Fair  Language:Fair   Psychomotor Activity  Psychomotor Activity:Psychomotor Activity: Normal   Assets  Assets:Communication Skills; Financial Resources/Insurance; Housing; Physical Health; Vocational/Educational   Sleep  Sleep:Sleep: Fair    Physical Exam: Physical Exam Vitals and nursing note reviewed.    ROS Blood pressure 129/80, pulse 74, temperature 98.5 F (36.9 C), temperature source Oral, resp. rate 15, height 5\' 7"  (1.702 m), weight 70.3 kg, SpO2 100%. Body mass index is 24.28 kg/m.  Treatment Plan Summary: Daily contact with patient to assess and evaluate symptoms and progress in treatment, Medication management, and Plan  Paul Bowers was admitted to [facility] under the service of No att. providers found for Suicide attempt by drug ingestion [facility]), crisis management, and stabilization. Routine labs ordered, which include Lab Orders         Comprehensive metabolic panel         Ethanol         Salicylate level         Acetaminophen level         cbc         Urine Drug Screen, Qualitative         Acetaminophen level         Salicylate level         CBG monitoring, ED    Medication Management: Medications started PRN Meds.  Will defer to inpatient provider to determine medication regime Will maintain observation checks every 15 minutes for safety. Psychosocial education regarding relapse prevention and self-care; social and communication  Social work will consult with family for collateral information and discuss discharge and follow up plan.  Disposition: Recommend psychiatric Inpatient admission when medically cleared. Supportive therapy provided about ongoing stressors. Discussed crisis plan, support  from social network, calling 911, coming to the Emergency Department, and calling Suicide Hotline.  This service was provided via telemedicine using a 2-way, interactive audio and video technology.     Jearld Lesch, NP 03/04/2023 4:48 AM

## 2023-03-04 NOTE — ED Notes (Signed)
Admission issues clarified, pt graduated high school last year, pt and parents were prepared for adult BMU admission and spoke with NP Penn twice today about this, pt will be escorted downstairs shortly.

## 2023-03-04 NOTE — ED Notes (Signed)
Called dietary at this time to confirm that a breakfast tray is in the way for this pt.

## 2023-03-04 NOTE — ED Notes (Signed)
IVC/pending inpatient psych admission 

## 2023-03-04 NOTE — Consult Note (Signed)
  Paul Bowers is a 17 yo male presenting under IVC following OTC drug overdose. Patient is noted sitting on his bed, eating his meal tray. Patient reports a history of depression with suicidal ideation, delusional thought, and hallucinations. Patient described previous delusional thought as feeling like he was in the [facility], stating that his mother and father were really not them. Patient states that he has experienced hallucinations consisting of hearing his mother calling him and seeing his father in his office, when he really was not present. Patient reports that he has not experienced delusional thought or hallucinations in a few days. Patient and his father also reports a family history of depression and suicide attempt with his mother.   Patient states that he also has a history of using Benadryl because "I liked the feeling", stating he started using Benadryl while enrolled at [employer]. Patient reports later having to call his parents and inform them of failing grades. Patient reports having to un-enroll from [employer]. Patient confirmed that delusional thought, hallucinations, and depression with suicidal ideations initiated prior to misuse of OTC medications. Patient reports that his initial depression followed him being bullied in school, prior to college years.  Today, patient reports never having SI with a plan, which does not coincide with his medical record. Patient reports not remembering this information, but his mother confirmed that SI with plan to hang himself did occur. Patient stated my memory is blocked from things that happened long ago as to why his provided history did not coincide with his medical record. Patient stated that he was not attempting suicide prior to presenting to the ED this time. His father, who is also present, stated that his son was "just trying to get high". Patient also reported that he had a supply of drugs for quite a while and was trying  to refrain from OTC medication misuse but "it won". Patient stated he gave in to cravings and used, attempting "to get high". Patient acknowledged how his poor judgment and actions could have been detrimental to his life. Patient reports that he "blacked out and then I got back up and took some more".   Patient's father reports "we brought him because we did not know how much he took and he was confused.Marland KitchenMarland KitchenI did not think we would end up here". Patient's father stated "now if he did this in October, I would agree with him being admitted". Patient's father shared that Barbara's symptoms have improved during the past months. He reports that patient is established with therapy on an outpatient basis. Patient's father voices concern that Paul Bowers will miss registration at [employer] to begin Fall classes if he is admitted inpatient. Patient and his family made aware of the previous provider's evaluation and recommendation for inpatient status. Patient and his family also made aware of this writer's agreement with the previous provider's recommendation for inpatient status to ensure patient's safety.  Permission to discuss confidential information was granted by [employer] prior to engaging with parents. Parents voiced concern that patient has not received his home medications. Home medications restarted.

## 2023-03-04 NOTE — ED Provider Notes (Incomplete)
[facility] Provider Note    Event Date/Time   First MD Initiated Contact with Patient 03/03/23 2026     (approximate)   History   Ingestion and Suicide Attempt   HPI  Paul Bowers is a 17 y.o. male history of substance abuse  Parents got a message from one of their children patient was not acting normally.  They got home and found that he had consumed about 12 Mucinex tablets which they brought with confirmed to contain guanfacine  no acetaminophen.  Also creatinine unknown number of Benadryl.  He has been hallucinating.  He has a history of similar substance abuse noted to begin and also diagnosis of schizophrenia occurring in the last year.  Patient himself unable to give circumstances of the ingestion.  Parents report they think this was done to get high, he has not made any statements or gestures to suggest he is suicidal     Physical Exam   Triage Vital Signs: ED Triage Vitals [03/03/23 2012]  Encounter Vitals Group     BP (!) 162/88     Systolic BP Percentile      Diastolic BP Percentile      Pulse Rate (!) 109     Resp 20     Temp 98.6 F (37 C)     Temp Source Oral     SpO2 97 %     Weight 155 lb (70.3 kg)     Height 5\' 7"  (1.702 m)     Head Circumference      Peak Flow      Pain Score 0     Pain Loc      Pain Education      Exclude from Growth Chart     Most recent vital signs: Vitals:   03/03/23 2300 03/03/23 2335  BP: (!) 145/90 (!) 148/96  Pulse: 93 (!) 103  Resp: 11 14  Temp:  100.3 F (37.9 C)  SpO2: 99% 100%     General: Awake, no distress.  He is somewhat hallucinating, reports he thinks he is in [place] mention something about needing to go to the store to buy dog food but also recognizes in the hospital He denies being suicidal, reports he has done this in the past to get high CV:  Good peripheral perfusion.  Very mild tachycardia This membranes slightly dry No notable nystagmus.  No  sweating. Resp:  Normal effort.  Clear bilateral Abd:  No distention.  Soft nontender Other:  No noted injuries.  Normocephalic atraumatic   ED Results / Procedures / Treatments   Labs (all labs ordered are listed, but only abnormal results are displayed) Labs Reviewed  COMPREHENSIVE METABOLIC PANEL - Abnormal; Notable for the following components:      Result Value   Total Protein 8.2 (*)    All other components within normal limits  SALICYLATE LEVEL - Abnormal; Notable for the following components:   Salicylate Lvl <7.0 (*)    All other components within normal limits  ACETAMINOPHEN LEVEL - Abnormal; Notable for the following components:   Acetaminophen (Tylenol), Serum <10 (*)    All other components within normal limits  URINE DRUG SCREEN, QUALITATIVE ([facility] ONLY) - Abnormal; Notable for the following components:   Tricyclic, Ur Screen POSITIVE (*)    Opiate, Ur Screen POSITIVE (*)    Cannabinoid 50 Ng, Ur [place] POSITIVE (*)    All other components within normal limits  ETHANOL  CBC  ACETAMINOPHEN LEVEL  SALICYLATE LEVEL  CBG MONITORING, ED     EKG  ***   RADIOLOGY *** {USE THE WORD "INTERPRETED"!! You MUST document your own interpretation of imaging, as well as the fact that you reviewed the radiologist's report!:1}   PROCEDURES:  Critical Care performed: {CriticalCareYesNo:19197::"Yes, see critical care procedure note(s)","No"}  Procedures   MEDICATIONS ORDERED IN ED: Medications  sodium chloride 0.9 % bolus 2,000 mL (0 mLs Intravenous Stopped 03/03/23 2316)     IMPRESSION / MDM / ASSESSMENT AND PLAN / ED COURSE  I reviewed the triage vital signs and the nursing notes.                              Differential diagnosis includes, but is not limited to, ***  Patient's presentation is most consistent with {EM COPA:27473}  *** {If the patient is on the monitor, remove the brackets and asterisks on the sentence below and remember to document it as a  Procedure as well. Otherwise delete the sentence below:1} {**The patient is on the cardiac monitor to evaluate for evidence of arrhythmia and/or significant heart rate changes.**} {Remember to include, when applicable, any/all of the following data: independent review of imaging independent review of labs (comment specifically on pertinent positives and negatives) review of specific prior hospitalizations, PCP/specialist notes, etc. discuss meds given and prescribed document any discussion with consultants (including hospitalists) any clinical decision tools you used and why (PECARN, NEXUS, etc.) did you consider admitting the patient? document social determinants of health affecting patient's care (homelessness, inability to follow up in a timely fashion, etc) document any pre-existing conditions increasing risk on current visit (e.g. diabetes and HTN increasing danger of high-risk chest pain/ACS) describes what meds you gave (especially parenteral) and why any other interventions?:1}     FINAL CLINICAL IMPRESSION(S) / ED DIAGNOSES   Final diagnoses:  None     Rx / DC Orders   ED Discharge Orders     None        Note:  This document was prepared using Dragon voice recognition software and may include unintentional dictation errors.

## 2023-03-04 NOTE — Group Note (Signed)
Date:  03/04/2023 Time:  10:57 PM  Group Topic/Focus:  Dimensions of Wellness:   The focus of this group is to introduce the topic of wellness and discuss the role each dimension of wellness plays in total health.    Participation Level:  Active  Participation Quality:  Appropriate  Affect:  Appropriate  Cognitive:  Alert and Appropriate  Insight: Good  Engagement in Group:  Developing/Improving  Modes of Intervention:  Discussion, Education, Problem-solving, Rapport Building, Socialization, and support   Additional Comments:     Kaylla Cobos 03/04/2023, 10:57 PM

## 2023-03-04 NOTE — Plan of Care (Signed)

## 2023-03-04 NOTE — ED Provider Notes (Signed)
[facility] Provider Note    Event Date/Time   First MD Initiated Contact with Patient 03/03/23 2026     (approximate)   History   Ingestion and Suicide Attempt   HPI  Paul Bowers is a 17 y.o. male history of substance abuse  Parents got a message from one of their children patient was not acting normally.  They got home and found that he had consumed about 12 Mucinex tablets which they brought with confirmed to contain guanfacine  no acetaminophen.  Also creatinine unknown number of Benadryl.  He has been hallucinating.  He has a history of similar substance abuse noted to begin and also diagnosis of schizophrenia occurring in the last year.  Patient himself unable to give circumstances of the ingestion.  Parents report they think this was done to get high, he has not made any statements or gestures to suggest he is suicidal     Physical Exam   Triage Vital Signs: ED Triage Vitals [03/03/23 2012]  Encounter Vitals Group     BP (!) 162/88     Systolic BP Percentile      Diastolic BP Percentile      Pulse Rate (!) 109     Resp 20     Temp 98.6 F (37 C)     Temp Source Oral     SpO2 97 %     Weight 155 lb (70.3 kg)     Height 5\' 7"  (1.702 m)     Head Circumference      Peak Flow      Pain Score 0     Pain Loc      Pain Education      Exclude from Growth Chart     Most recent vital signs: Vitals:   03/03/23 2300 03/03/23 2335  BP: (!) 145/90 (!) 148/96  Pulse: 93 (!) 103  Resp: 11 14  Temp:  100.3 F (37.9 C)  SpO2: 99% 100%     General: Awake, no distress.  He is somewhat hallucinating, reports he thinks he is in [place] mention something about needing to go to the store to buy dog food but also recognizes in the hospital He denies being suicidal, reports he has done this in the past to get high CV:  Good peripheral perfusion.  Very mild tachycardia This membranes slightly dry No notable nystagmus.  No  sweating. Resp:  Normal effort.  Clear bilateral Abd:  No distention.  Soft nontender Other:  No noted injuries.  Normocephalic atraumatic   ED Results / Procedures / Treatments   Labs (all labs ordered are listed, but only abnormal results are displayed) Labs Reviewed  COMPREHENSIVE METABOLIC PANEL - Abnormal; Notable for the following components:      Result Value   Total Protein 8.2 (*)    All other components within normal limits  SALICYLATE LEVEL - Abnormal; Notable for the following components:   Salicylate Lvl <7.0 (*)    All other components within normal limits  ACETAMINOPHEN LEVEL - Abnormal; Notable for the following components:   Acetaminophen (Tylenol), Serum <10 (*)    All other components within normal limits  URINE DRUG SCREEN, QUALITATIVE ([facility] ONLY) - Abnormal; Notable for the following components:   Tricyclic, Ur Screen POSITIVE (*)    Opiate, Ur Screen POSITIVE (*)    Cannabinoid 50 Ng, Ur [place] POSITIVE (*)    All other components within normal limits  ETHANOL  CBC  ACETAMINOPHEN LEVEL  SALICYLATE LEVEL  CBG MONITORING, ED     EKG  Entered by me at 2020 heart rate 105 QRS 90 QTc 440 Normal sinus rhythm, no evidence of acute ischemia.  QTc normal some terminal elevation of aVR noted, but QRS complex normal width   RADIOLOGY     PROCEDURES:  Critical Care performed: Yes, see critical care procedure note(s) CRITICAL CARE Performed by: Sharyn Creamer   Total critical care time: 35 minutes  Critical care time was exclusive of separately billable procedures and treating other patients.  Critical care was necessary to treat or prevent imminent or life-threatening deterioration.  Critical care was time spent personally by me on the following activities: development of treatment plan with patient and/or surrogate as well as nursing, discussions with consultants, evaluation of patient's response to treatment, examination of patient, obtaining history from  patient or surrogate, ordering and performing treatments and interventions, ordering and review of laboratory studies, ordering and review of radiographic studies, pulse oximetry and re-evaluation of patient's condition.  Procedures   MEDICATIONS ORDERED IN ED: Medications  sodium chloride 0.9 % bolus 2,000 mL (0 mLs Intravenous Stopped 03/03/23 2316)     IMPRESSION / MDM / ASSESSMENT AND PLAN / ED COURSE  I reviewed the triage vital signs and the nursing notes.                              Differential diagnosis includes, but is not limited to, overdose of unknown intent, suspect recreational but given the patient's hallucinations, confusion will place under IVC for his own safety as well as he is lack of capacity.  Symptoms and ingestion most consistent with anticholinergic type toxidrome with dry mouth, mild tachycardia, hallucinations.  No evidence of seizure activity.  Hemodynamics are relatively stable, we will hydrate and observe closely.  Initial labs reassuring, normal acetaminophen and salicylate levels.  Awaiting repeat studies of EKG and salicylate levels to follow at approximately 2 AM.    Patient's presentation is most consistent with acute presentation with potential threat to life or bodily function.   The patient is on the cardiac monitor to evaluate for evidence of arrhythmia and/or significant heart rate changes.  Poison control consulted   Poison control contacted and recommendations as follows:    Monitor for delirium, combativeness, urinary retention. Monitor QRS and QTC routinely and repeat EKG in 4 hours. Draw a tylenol level now and repeat in 4 hours. Administer IVF and Benzo's as needed. Minimum observation time of 6 hours on cardiac monitor.  Psychiatry consult requested.  Placed under IVC for safety.  Ongoing care including ongoing medical treatment care and observation assigned to my partner Dr. Katrinka Blazing at 1210am   FINAL CLINICAL IMPRESSION(S) / ED DIAGNOSES    Final diagnoses:  Overdose, undetermined intent, initial encounter     Rx / DC Orders   ED Discharge Orders     None        Note:  This document was prepared using Dragon voice recognition software and may include unintentional dictation errors.   Sharyn Creamer, MD 03/04/23 [phone]

## 2023-03-04 NOTE — ED Notes (Signed)
Patient is IVC pending psych inpatient admit

## 2023-03-04 NOTE — Progress Notes (Signed)
Pt is pleasant and cooperative with admission process Pt denies any suicidal ideation or homicidal ideation,Pt stated that he was taking  the meds he took to get high.His parents agree with this statement.His parents do not agree with his involuntary status.Pt stated he will return home upon discharge and he wants to register for technical college.

## 2023-03-04 NOTE — Group Note (Unsigned)
Date:  03/04/2023 Time:  8:53 PM  Group Topic/Focus:  Dimensions of Wellness:   The focus of this group is to introduce the topic of wellness and discuss the role each dimension of wellness plays in total health.     Participation Level:  {BHH PARTICIPATION NWGNF:62130}  Participation Quality:  {BHH PARTICIPATION QUALITY:22265}  Affect:  {BHH AFFECT:22266}  Cognitive:  {BHH COGNITIVE:22267}  Insight: {BHH Insight2:20797}  Engagement in Group:  {BHH ENGAGEMENT IN QMVHQ:46962}  Modes of Intervention:  {BHH MODES OF INTERVENTION:22269}  Additional Comments:  ***  Jalal Rauch 03/04/2023, 8:53 PM

## 2023-03-04 NOTE — ED Notes (Signed)
Mother of pt now at bedside as well as father, awaiting psych NP to come explain plan to them.

## 2023-03-04 NOTE — ED Notes (Signed)
Patient accepted to [facility] unit 301 and can arrive at 1400.  Attending: Dr Marlou Porch.  Dx: MDD.  Called [facility] to give report at [phone]. Spoke with Johnny Bridge, RN. They will call back on this RN ascom number [phone].

## 2023-03-04 NOTE — BH Assessment (Signed)
Patient is still under medical observation.  Psych  will follow-up with patient once patient is medically cleared.

## 2023-03-04 NOTE — ED Notes (Signed)
Father of pt here to visit pt. Father and pt made aware of visitation policy. Pt was dressed out this morning and moved to room 20. Parents had been sitting with pt in room although pt was already under IVC.

## 2023-03-04 NOTE — ED Notes (Addendum)
This RN went to the lobby and spoke with patients mother and father. Patient father had questions as to why the patient was still being held here. Per Father, they were told this morning that patient would be re-evaluated by psych and would possibly be going home. Parents also questions why they were asked to leave when they have been with him the whole time. This charge nurse explained that the parents were asked to leave because patient is now medically cleared and under psych hold and moved to behavioral area. Parents expressed concern that some things the patient said during the psych evaluation was take out of context and that patient was misunderstood. Parents are insistent that patient did this to get high and not in an attempted to self-harm. Parent requested that when re-evaluation is done that they are allowed to be present. Family was informed that we would allow this.   When this RN returned to the patient care area, I was informed by [facility] psych's plan was to admit pt to inpatient unit, not to re-evaluate pt.

## 2023-03-04 NOTE — Group Note (Unsigned)
Date:  03/04/2023 Time:  9:57 PM  Group Topic/Focus:  Dimensions of Wellness:   The focus of this group is to introduce the topic of wellness and discuss the role each dimension of wellness plays in total health.     Participation Level:  {BHH PARTICIPATION JXBJY:78295}  Participation Quality:  {BHH PARTICIPATION QUALITY:22265}  Affect:  {BHH AFFECT:22266}  Cognitive:  {BHH COGNITIVE:22267}  Insight: {BHH Insight2:20797}  Engagement in Group:  {BHH ENGAGEMENT IN AOZHY:86578}  Modes of Intervention:  {BHH MODES OF INTERVENTION:22269}  Additional Comments:  ***  Quinn Bartling 03/04/2023, 9:57 PM

## 2023-03-04 NOTE — BH Assessment (Signed)
Comprehensive Clinical Assessment (CCA) Note  03/04/2023 Paul Bowers [id number]  Chief Complaint: Patient is a 17 year old male presenting to [facility] ED under IVC. Per triage note Patient BIB parents after intentional OD and suicidal attempt around 1700 today. Parents state patient took 14 mucinex, unknown amount of benadryl, and used his THC vape. Patient has hx of same last October. Patient observed by family doing odd things and empty pill bottles in bedroom. Parents state patient may have been trying to get high instead of harm himself. During assessment patient appears alert and oriented x4, calm and cooperative. Patient reports "I took a bunch of pills, there was no intention to hurt myself, I just wanted to get high." Patient reports that he used to smoke marijuana and drank alcohol but hasn't had those substance since June. 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 Patient reports that he is prescribed medications for his mental health that he takes. Patient denies SI/HI/VH.  Patient is presenting with his father [name] who also reports that the patient was using the medication to get high and reports that this was not attempt to hurt himself "there's research where kids are using Benadryl to get high." Father minimizes the actions that the patient took tonight throughout the assessment and reports that the patient "did worse than this in October." Father reports that he used more Benadryl in the past and that the patient was discharged to his parent's care after he was medically cleared. Father reports that the patient has had  partial hospitalization within the past 6 months at a treatment facility in [place].   Per Psyc NP Lerry Liner patient is recommended for Inpatient Chief Complaint  Patient presents with   Ingestion   Suicide Attempt   Visit Diagnosis: Major Depressive Disorder, recurrent episode, severe    CCA Screening, Triage and Referral (STR)  Patient Reported Information How did you hear about [place]? Family/Friend  Referral name: No data recorded Referral phone number: No data recorded  Whom do you see for routine medical problems? No data recorded Practice/Facility Name: No data recorded Practice/Facility Phone Number: No data recorded Name of Contact: No data recorded Contact Number: No data recorded Contact Fax Number: No data recorded Prescriber Name: No data recorded Prescriber Address (if known): No data recorded  What Is the Reason for Your Visit/Call Today? Patient BIB parents after intentional OD and suicidal attempt around 1700 today.  Parents state patient took 14 mucinex, unknown amount of benadryl, and used his THC vape.  Patient has hx of same last October.  Patient observed by family doing odd things and empty pill bottles in bedroom.  Parents state patient may have been trying to get high instead of harm himself.  How Long Has This Been Causing You Problems? > than 6 months  What Do You Feel Would Help You the Most Today? No data recorded  Have You Recently Been in Any Inpatient Treatment (Hospital/Detox/Crisis Center/28-Day Program)? No data recorded Name/Location of Program/Hospital:No data recorded How Long Were You There? No data recorded When Were You Discharged? No data recorded  Have You Ever Received Services From [facility] Before? No data  recorded Who Do You See at [facility]? No data recorded  Have You Recently Had Any Thoughts About Hurting Yourself? No  Are You Planning to Commit Suicide/Harm Yourself At This time? No   Have you Recently Had Thoughts  About Hurting Someone Karolee Ohs? No  Explanation: No data recorded  Have You Used Any Alcohol or Drugs in the Past 24 Hours? No  How Long Ago Did You Use Drugs or Alcohol? No data recorded What Did You Use and How Much? No data recorded  Do You Currently Have a Therapist/Psychiatrist? Yes  Name of Therapist/Psychiatrist: [facility]   Have You Been Recently Discharged From Any Office Practice or Programs? No  Explanation of Discharge From Practice/Program: No data recorded    CCA Screening Triage Referral Assessment Type of Contact: Face-to-Face  Is this Initial or Reassessment? No data recorded Date Telepsych consult ordered in [facility]:  No data recorded Time Telepsych consult ordered in [facility]:  No data recorded  Patient Reported Information Reviewed? No data recorded Patient Left Without Being Seen? No data recorded Reason for Not Completing Assessment: No data recorded  Collateral Involvement: No data recorded  Does Patient Have a Court Appointed Legal Guardian? No data recorded Name and Contact of Legal Guardian: No data recorded If Minor and Not Living with Parent(s), Who has Custody? No data recorded Is CPS involved or ever been involved? Never  Is APS involved or ever been involved? Never   Patient Determined To Be At Risk for Harm To Self or Others Based on Review of Patient Reported Information or Presenting Complaint? Yes, for Self-Harm  Method: No data recorded Availability of Means: No data recorded Intent: No data recorded Notification Required: No data recorded Additional Information for Danger to Others Potential: No data recorded Additional Comments for Danger to Others Potential: No data recorded Are There Guns or Other Weapons in Your Home? No  Types of Guns/Weapons: No data recorded Are These Weapons Safely Secured?                            No data recorded Who Could Verify You Are Able To Have These Secured: No data recorded Do You Have any  Outstanding Charges, Pending Court Dates, Parole/Probation? No data recorded Contacted To Inform of Risk of Harm To Self or Others: No data recorded  Location of Assessment: [facility] ED   Does Patient Present under Involuntary Commitment? Yes  IVC Papers Initial File Date: No data recorded  [place] of Residence: [place]   Patient Currently Receiving the Following Services: Medication Management; Individual Therapy   Determination of Need: Emergent (2 hours)   Options For Referral: No data recorded    CCA Biopsychosocial Intake/Chief Complaint:  No data recorded Current Symptoms/Problems: No data recorded  Patient Reported Schizophrenia/Schizoaffective Diagnosis in Past: No   Strengths: Patient is able to communicate his needs; has a supportive family  Preferences: No data recorded Abilities: No data recorded  Type of Services Patient Feels are Needed: No data recorded  Initial Clinical Notes/Concerns: No data recorded  Mental Health Symptoms Depression:   Change in energy/activity; Difficulty Concentrating; Fatigue; Worthlessness   Duration of Depressive symptoms:  Greater than two weeks   Mania:   None   Anxiety:    Difficulty concentrating; Fatigue; Restlessness; Worrying   Psychosis:   Hallucinations   Duration of Psychotic symptoms:  Less than six months   Trauma:   None   Obsessions:  None   Compulsions:   None   Inattention:   None   Hyperactivity/Impulsivity:   None   Oppositional/Defiant Behaviors:   None   Emotional Irregularity:   None   Other Mood/Personality Symptoms:  No data recorded   Mental Status Exam Appearance and self-care  Stature:   Average   Weight:   Average weight   Clothing:   Casual   Grooming:   Normal   Cosmetic use:   None   Posture/gait:   Normal   Motor activity:   Not Remarkable   Sensorium  Attention:   Normal   Concentration:   Normal   Orientation:   X5   Recall/memory:    Normal   Affect and Mood  Affect:   Appropriate   Mood:   Other (Comment)   Relating  Eye contact:   Normal   Facial expression:   Responsive   Attitude toward examiner:   Cooperative   Thought and Language  Speech flow:  Clear and Coherent   Thought content:   Appropriate to Mood and Circumstances   Preoccupation:   None   Hallucinations:   None   Organization:  No data recorded  [employer] of Knowledge:   Fair   Intelligence:   Average   Abstraction:   Normal   Judgement:   Poor   Reality Testing:   Adequate   Insight:   Lacking; Poor; Denial   Decision Making:   Impulsive   Social Functioning  Social Maturity:   Impulsive   Social Judgement:   Heedless   Stress  Stressors:   Other (Comment)   Coping Ability:   Exhausted   Skill Deficits:   None   Supports:   Family; Friends/Service system     Religion: Religion/Spirituality Are You A Religious Person?: No  Leisure/Recreation: Leisure / Recreation Do You Have Hobbies?: No  Exercise/Diet: Exercise/Diet Do You Exercise?: No Have You Gained or Lost A Significant Amount of Weight in the Past Six Months?: No Do You Follow a Special Diet?: No Do You Have Any Trouble Sleeping?: No   CCA Employment/Education Employment/Work Situation: Employment / Work Situation Employment Situation: Employed Work Stressors: None reported Patient's Job has Been Impacted by Current Illness: No Has Patient ever Been in Equities trader?: No  Education: Education Is Patient Currently Attending School?: No Did Theme park manager?: No Did You Have An Individualized Education Program (IIEP): No Did You Have Any Difficulty At [employer]?: No Patient's Education Has Been Impacted by Current Illness: No   CCA Family/Childhood History Family and Relationship History: Family history Marital status: Single Does patient have children?: No  Childhood History:  Childhood  History By whom was/is the patient raised?: Both parents Did patient suffer any verbal/emotional/physical/sexual abuse as a child?: No Did patient suffer from severe childhood neglect?: No Has patient ever been sexually abused/assaulted/raped as an adolescent or adult?: No Was the patient ever a victim of a crime or a disaster?: No Witnessed domestic violence?: No Has patient been affected by domestic violence as an adult?: No  Child/Adolescent Assessment:     CCA Substance Use Alcohol/Drug Use: Alcohol / Drug Use Pain Medications: see mar Prescriptions: see mar Over the Counter: see mar History of alcohol / drug use?: Yes Substance #1 Name of Substance 1: marijuana Substance #2 Name of Substance 2: alcohol                     ASAM's:  Six  Dimensions of Multidimensional Assessment  Dimension 1:  Acute Intoxication and/or Withdrawal Potential:      Dimension 2:  Biomedical Conditions and Complications:      Dimension 3:  Emotional, Behavioral, or Cognitive Conditions and Complications:     Dimension 4:  Readiness to Change:     Dimension 5:  Relapse, Continued use, or Continued Problem Potential:     Dimension 6:  Recovery/Living Environment:     ASAM Severity Score:    ASAM Recommended Level of Treatment:     Substance use Disorder (SUD)    Recommendations for Services/Supports/Treatments:    DSM5 Diagnoses: Patient Active Problem List   Diagnosis Date Noted   Suicide attempt by drug ingestion (HCC) 03/04/2023   Tourette disorder 06/19/2018   Mixed obsessional thoughts and acts 06/19/2018   Attention deficit hyperactivity disorder, combined type, moderate 06/19/2018   Depression, major, recurrent, in partial remission (HCC) 10/07/2016   Suicide ideation 06/29/2016    Patient Centered Plan: Patient is on the following Treatment Plan(s):  Depression   Referrals to Alternative Service(s): Referred to Alternative Service(s):   Place:   Date:   Time:     Referred to Alternative Service(s):   Place:   Date:   Time:    Referred to Alternative Service(s):   Place:   Date:   Time:    Referred to Alternative Service(s):   Place:   Date:   Time:      @[facility] @  [employer], LCAS-A

## 2023-03-04 NOTE — ED Notes (Signed)
This tech collected urine

## 2023-03-04 NOTE — ED Notes (Signed)
This RN in room to speak to patient father. Pts father was informed that I was not aware that psych NP was here seeing patient and that we had called to see if she was able to come back and speak with the parents. Psych NP agreed to come speak with patient and parents together. Front desk staff was informed OK for mother to come back while NP is speaking to patient.

## 2023-03-04 NOTE — ED Notes (Signed)
Pt received his breakfast tray at this time.

## 2023-03-04 NOTE — ED Notes (Signed)
Pt belongings:  Blue underwear Earrings

## 2023-03-04 NOTE — Tx Team (Signed)
Initial Treatment Plan 03/04/2023 5:49 PM Paul Bowers [id number]    PATIENT STRESSORS: Substance abuse     PATIENT STRENGTHS: Ability for insight  Active sense of humor  Average or above average intelligence  Capable of independent living  Communication skills  General fund of knowledge  Physical Health  Supportive family/friends    PATIENT IDENTIFIED PROBLEMS:   OTC medication abuse                   DISCHARGE CRITERIA:  Ability to meet basic life and health needs Adequate post-discharge living arrangements Improved stabilization in mood, thinking, and/or behavior  PRELIMINARY DISCHARGE PLAN: Outpatient therapy Return to previous living arrangement Return to previous work or school arrangements  PATIENT/FAMILY INVOLVEMENT: This treatment plan has been presented to and reviewed with the patient, Paul Bowers .  The patient and family have been given the opportunity to ask questions and make suggestions.  Bonnita Hollow, RN 03/04/2023, 5:49 PM

## 2023-03-04 NOTE — ED Notes (Signed)
Staff at [facility] now stating that admission to adolescent unit would be better. Charge nurse made aware.

## 2023-03-04 NOTE — ED Provider Notes (Signed)
-----------------------------------------   7:12 AM on 03/04/2023 -----------------------------------------  The patient was placed in psychiatric observation due to the need to provide a safe environment for the patient while obtaining psychiatric consultation and evaluation, as well as ongoing medical and medication management to treat the patient's condition.  The patient has been placed under full IVC at this time.  He has received his initial psychiatric evaluation, but given that he has returned to baseline and does not appear to be a danger to himself or others, he will be reassessed this morning by the psychiatry team to determine if inpatient treatment is still recommended.  The patient's father is with him and is very comfortable taking him home.   Loleta Rose, MD 03/04/23 [phone]

## 2023-03-05 DIAGNOSIS — F332 Major depressive disorder, recurrent severe without psychotic features: Secondary | ICD-10-CM

## 2023-03-05 MED ORDER — PROPRANOLOL HCL 20 MG PO TABS
20.0000 mg | ORAL_TABLET | Freq: Three times a day (TID) | ORAL | Status: DC
  Administered 2023-03-05: 20 mg via ORAL
  Filled 2023-03-05: qty 1

## 2023-03-05 MED ORDER — PROPRANOLOL HCL 20 MG PO TABS: 20.0000 mg | ORAL_TABLET | Freq: Three times a day (TID) | ORAL | Status: DC | PRN

## 2023-03-05 MED ORDER — ARIPIPRAZOLE 10 MG PO TABS
10.0000 mg | ORAL_TABLET | Freq: Every day | ORAL | Status: DC
  Administered 2023-03-05 – 2023-03-07 (×3): 10 mg via ORAL
  Filled 2023-03-05 (×3): qty 1

## 2023-03-05 MED ORDER — DEXTROMETHORPHAN-BUPROPION ER 45-105 MG PO TBCR
1.0000 | EXTENDED_RELEASE_TABLET | Freq: Two times a day (BID) | ORAL | Status: DC
  Administered 2023-03-05 – 2023-03-07 (×4): 1 via ORAL
  Filled 2023-03-05 (×4): qty 1

## 2023-03-05 MED ORDER — DEXTROMETHORPHAN-BUPROPION ER 45-105 MG PO TBCR: 1.0000 | EXTENDED_RELEASE_TABLET | Freq: Two times a day (BID) | ORAL | Status: DC

## 2023-03-05 MED ORDER — PROPRANOLOL HCL 20 MG PO TABS: 20.0000 mg | ORAL_TABLET | Freq: Three times a day (TID) | ORAL | Status: DC

## 2023-03-05 MED ORDER — PROPRANOLOL HCL 20 MG PO TABS
20.0000 mg | ORAL_TABLET | Freq: Three times a day (TID) | ORAL | Status: DC | PRN
  Administered 2023-03-05 – 2023-03-06 (×2): 20 mg via ORAL
  Filled 2023-03-05 (×2): qty 1

## 2023-03-05 NOTE — Plan of Care (Signed)

## 2023-03-05 NOTE — Group Note (Signed)
[facility] LCSW Group Therapy Note   Group Date: 03/05/2023 Start Time: 1300 End Time: 1400   Type of Therapy/Topic:  Group Therapy:  Balance in Life  Participation Level:  Active   Description of Group:    This group will address the concept of balance and how it feels and looks when one is unbalanced. Patients will be encouraged to process areas in their lives that are out of balance, and identify reasons for remaining unbalanced. Facilitators will guide patients utilizing problem- solving interventions to address and correct the stressor making their life unbalanced. Understanding and applying boundaries will be explored and addressed for obtaining  and maintaining a balanced life. Patients will be encouraged to explore ways to assertively make their unbalanced needs known to significant others in their lives, using other group members and facilitator for support and feedback.  Therapeutic Goals: Patient will identify two or more emotions or situations they have that consume much of in their lives. Patient will identify signs/triggers that life has become out of balance:  Patient will identify two ways to set boundaries in order to achieve balance in their lives:  Patient will demonstrate ability to communicate their needs through discussion and/or role plays  Summary of Patient Progress:   The patient attended Group.  Patient proved open to input from peers and feedback from [facility]. Patient demonstrated insight into the subject matter, was respectful of peers, and participated throughout the entire session. The patient participated during the icebreaker. The patient stated that vaping is something he can do without but its hard.      Marshell Levan, LCSW

## 2023-03-05 NOTE — [facility] Suicide Risk Assessment (Signed)
[facility] INPATIENT:  Family/Significant Other Suicide Prevention Education  Suicide Prevention Education:  Patient Refusal for Family/Significant Other Suicide Prevention Education: The patient Paul Bowers has refused to provide written consent for family/significant other to be provided Family/Significant Other Suicide Prevention Education during admission and/or prior to discharge.  Physician notified.  The patient declined contacting a family member or significant other for suicide prevention education. The LCSWA provided the patient with a Suicide Prevention Education pamphlet.  Marshell Levan 03/05/2023, 12:47 PM

## 2023-03-05 NOTE — BHH Counselor (Signed)
Adult Comprehensive Assessment  Patient ID: TESLA BOCHICCHIO, male   DOB: 2005/08/25, 17 y.o.   MRN: [id number]  Information Source: Information source: Patient  Current Stressors:  Patient states their primary concerns and needs for treatment are:: The patient stated that he was not trying to kill himself, it was an overdose, and he was brought here because he was incoherent. Patient states their goals for this hospitilization and ongoing recovery are:: The patient stated that he wants to be listened to and be apart of his treatment plan. Educational / Learning stressors: The patient stated that he is supopose to register for classess tomorrow and that he needs to be discharged. Employment / Job issues: The patient stated that he has missed 2 sifts from work and is afriad of being fired. Family Relationships: The patient stated none. Financial / Lack of resources (include bankruptcy): The patient stated none. Housing / Lack of housing: The patient stated none. Physical health (include injuries & life threatening diseases): The patient stated none. Social relationships: The patient stated none. Substance abuse: The patient stated that he used Benadryl and Mucinex to get high. Bereavement / Loss: The patient stated none.  Living/Environment/Situation:  Living Arrangements: Parent Living conditions (as described by patient or guardian): The patient stated that it was standard. Who else lives in the home?: The patient stated his parents and 4 siblings How long has patient lived in current situation?: The patient stated his whole life. What is atmosphere in current home: Loving  Family History:  Marital status: Single Are you sexually active?: No Does patient have children?: No  Childhood History:  By whom was/is the patient raised?: Both parents Description of patient's relationship with caregiver when they were a child: The patient stated that his parents were loving and  caring. Patient's description of current relationship with people who raised him/her: The patient stated that his parents are even more loving and caring. How were you disciplined when you got in trouble as a child/adolescent?: The patient stated timeouts, no going outside, and taking things away. Does patient have siblings?: Yes Number of Siblings: 4 Description of patient's current relationship with siblings: The patient stated that it is a good relationship but they are not close. Did patient suffer any verbal/emotional/physical/sexual abuse as a child?: No Did patient suffer from severe childhood neglect?: No Has patient ever been sexually abused/assaulted/raped as an adolescent or adult?: No Was the patient ever a victim of a crime or a disaster?: No Witnessed domestic violence?: No Has patient been affected by domestic violence as an adult?: No (the patient stated he witnessed strangers.)  Education:  Highest grade of school patient has completed: The patient stated high school. Currently a student?: Yes Name of school: The patient stated [employer]. How long has the patient attended?: The patient was admitted and waiting to register for classes. 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 Are You Satisfied With Your Job?: Yes Do You Work More Than One Job?: Yes Work Stressors: The patient stated that he gets overwhelmed and starts to hear and see things. Patient's Job has Been Impacted by Current Illness: Yes Describe how Patient's Job has Been Impacted: The patient stated that he has missed shifts by being here. What  is the Longest Time Patient has Held a Job?: the patient stated 3 years. Where was  the Patient Employed at that Time?: The patient stated his current job at the stadium. Has Patient ever Been in the [employer]?: No  Financial Resources:   Financial resources: Income from employment, Private insurance Does patient have a representative payee or guardian?: No  Alcohol/Substance Abuse:   What has been your use of drugs/alcohol within the last 12 months?: The patient stated that he used Benadryl and Mucinex to get high. If attempted suicide, did drugs/alcohol play a role in this?: No (The patient stated that it was a overdose not suicide.) Alcohol/Substance Abuse Treatment Hx: Past Tx, Inpatient If yes, describe treatment: The patient stated that he did not like it. Has alcohol/substance abuse ever caused legal problems?: No  Social Support System:   Patient's Community Support System: Good Describe Community Support System: The patient stated that he has his parents and friends. Type of faith/religion: The patient stated none.  Leisure/Recreation:   Do You Have Hobbies?: Yes Leisure and Hobbies: The patient stated reading, games, puzzles, art, and he has fish.  Strengths/Needs:   What is the patient's perception of their strengths?: The patient stated his intelligence. Patient states they can use these personal strengths during their treatment to contribute to their recovery: The patient stated that he will use it by being rational. Patient states these barriers may affect/interfere with their treatment: The patient stated none. Patient states these barriers may affect their return to the community: The patient stated none.  Discharge Plan:   Currently receiving community mental health services: Yes (From Whom) (The patient is currently recieving treatment from [facility] and [facility].) Patient states concerns and preferences for aftercare planning are: The patient stated none. Patient states they will know when they are safe and ready for discharge when: The patient  stated he is ready. Does patient have access to transportation?: Yes Does patient have financial barriers related to discharge medications?: No Will patient be returning to same living situation after discharge?: Yes  Summary/Recommendations:   Summary and Recommendations (to be completed by the evaluator): The patient is a 17 year old Caucasian male from [place] [facility] [place]) presenting to [facility] ED under IVC. Per triage note Patient BIB parents after intentional OD and suicidal attempt around 1700 today. Parents state patient took 7 Mucinex, unknown amount of Benadryl, and used his THC vape. Patient has hx of same last October. Patient observed by family doing odd things and empty pill bottles in bedroom. The patient stated that he was nit trying to harm himself but trying to get high by using Benadryl and Mucinex. " Patient reports that he used to smoke marijuana and drank alcohol but hasn't had those substance since June. Patient reports that he took the medication due to "mental health issues, I have a unofficial diagnosis of Schizophrenia". Patient currently has a psychiatrist and a therapist with [facility] in [place] and patient has a diagnosis of Depression and ADHD. The patient stated that she also receives treatment at [facility] located in [place]. Patient reports that he is prescribed medications for his mental health that he takes. Patient denies SI/HI/VH. The patient stated there is no guns or weapons in the home. The patient stated he is insured through his parent's insurance [employer].  Recommendations include crisis stabilization, therapeutic milieu, encourage group attendance and participation, medication management for mood stabilization, and development of a comprehensive mental wellness/sobriety plan.  Marshell Levan. 03/05/2023

## 2023-03-05 NOTE — [facility] Suicide Risk Assessment (Signed)
[facility] Admission Suicide Risk Assessment   Nursing information obtained from:    Demographic factors:  Male Current Mental Status:  NA Loss Factors:  NA Historical Factors:  Prior suicide attempts Risk Reduction Factors:  Positive social support  Total Time spent with patient: 1 hour Principal Problem: MDD (major depressive disorder), recurrent severe, without psychosis (HCC) Diagnosis:  Principal Problem:   MDD (major depressive disorder), recurrent severe, without psychosis (HCC)  Subjective Data: Paul Bowers is a 17 yo male presenting under IVC following OTC drug overdose. Patient is noted sitting on his bed, eating his meal tray. Patient reports a history of depression with suicidal ideation, delusional thought, and hallucinations. Patient described previous delusional thought as feeling like he was in the [facility], stating that his mother and father were really not them. Patient states that he has experienced hallucinations consisting of hearing his mother calling him and seeing his father in his office, when he really was not present. Patient reports that he has not experienced delusional thought or hallucinations in a few days. Patient and his father also reports a family history of depression and suicide attempt with his mother.    Patient states that he also has a history of using Benadryl because "I liked the feeling", stating he started using Benadryl while enrolled at [employer]. Patient reports later having to call his parents and inform them of failing grades. Patient reports having to un-enroll from [employer]. Patient confirmed that delusional thought, hallucinations, and depression with suicidal ideations initiated prior to misuse of OTC medications. Patient reports that his initial depression followed him being bullied in school, prior to college years.  Today, patient reports never having SI with a plan, which does not coincide with his medical record. Patient  reports not remembering this information, but his mother confirmed that SI with plan to hang himself did occur. Patient stated my memory is blocked from things that happened long ago as to why his provided history did not coincide with his medical record. Patient stated that he was not attempting suicide prior to presenting to the ED this time. His father, who is also present, stated that his son was "just trying to get high". Patient also reported that he had a supply of drugs for quite a while and was trying to refrain from OTC medication misuse but "it won". Patient stated he gave in to cravings and used, attempting "to get high". Patient acknowledged how his poor judgment and actions could have been detrimental to his life. Patient reports that he "blacked out and then I got back up and took some more".    Patient's father reports "we brought him because we did not know how much he took and he was confused.Marland KitchenMarland KitchenI did not think we would end up here". Patient's father stated "now if he did this in October, I would agree with him being admitted". Patient's father shared that Paul Bowers's symptoms have improved during the past months. He reports that patient is established with therapy on an outpatient basis. Patient's father voices concern that Paul Bowers will miss registration at [employer] to begin Fall classes if he is admitted inpatient. Patient and his family made aware of the previous provider's evaluation and recommendation for inpatient status. Patient and his family also made aware of this writer's agreement with the previous provider's recommendation for inpatient status to ensure patient's safety.  Continued Clinical Symptoms:    The "Alcohol Use Disorders Identification Test", Guidelines for Use in Primary Care, Second Edition.  World Science writer [facility]). Score between 0-7:  no or low risk or alcohol related problems. Score between 8-15:  moderate risk of alcohol related problems. Score between 16-19:   high risk of alcohol related problems. Score 20 or above:  warrants further diagnostic evaluation for alcohol dependence and treatment.   CLINICAL FACTORS:   Depression:   Impulsivity   Musculoskeletal: Strength & Muscle Tone: within normal limits Gait & Station: normal Patient leans: N/A  Psychiatric Specialty Exam:  Presentation  General Appearance:  Appropriate for Environment; Casual  Eye Contact: Fair  Speech: Clear and Coherent  Speech Volume: Decreased  Handedness: Right   Mood and Affect  Mood: Anxious; Depressed; Dysphoric; Worthless  Affect: Depressed; Flat; Restricted   Thought Process  Thought Processes: Coherent  Descriptions of Associations:Intact  Orientation:Full (Time, Place and Person)  Thought Content:WDL  History of Schizophrenia/Schizoaffective disorder:No  Duration of Psychotic Symptoms:Less than six months  Hallucinations:Hallucinations: Auditory Description of Auditory Hallucinations: drug induced  Ideas of Reference:None  Suicidal Thoughts:Suicidal Thoughts: Yes, Active SI Active Intent and/or Plan: With Plan; With Means to Carry Out; With Intent  Homicidal Thoughts:Homicidal Thoughts: No   Sensorium  Memory: Immediate Fair; Remote Fair  Judgment: Impaired  Insight: Lacking   Executive Functions  Concentration: Fair  Attention Span: Fair  Recall: [employer] of Knowledge: Fair  Language: Fair   Psychomotor Activity  Psychomotor Activity: Psychomotor Activity: Normal   Assets  Assets: Manufacturing systems engineer; Financial Resources/Insurance; Housing; Physical Health; Vocational/Educational   Sleep  Sleep: Sleep: Fair     Blood pressure (!) 107/50, pulse (!) 55, temperature 97.9 F (36.6 C), resp. rate 20, height 5\' 7"  (1.702 m), weight 70.3 kg, SpO2 99%. Body mass index is 24.28 kg/m.   COGNITIVE FEATURES THAT CONTRIBUTE TO RISK:  Closed-mindedness    SUICIDE RISK:   Mild:   Suicidal ideation of limited frequency, intensity, duration, and specificity.  There are no identifiable plans, no associated intent, mild dysphoria and related symptoms, good self-control (both objective and subjective assessment), few other risk factors, and identifiable protective factors, including available and accessible social support.  Possibly accidental  PLAN OF CARE: See orders  I certify that inpatient services furnished can reasonably be expected to improve the patient's condition.   Libero Puthoff [place], DO 03/05/2023, 1:08 PM

## 2023-03-05 NOTE — Progress Notes (Signed)
Pt is calm and cooperative, denies SI, HI, AVH.  Pt attended groups and was compliant with medications.  Pt feels he is ready to go home.  MD was notified.  Pt's family brought in pt's home medication and it was sent to pharmacy for processing and dispensing in house.  Pt was kept informed of the status of his medication orders and scheduling.  Continued monitoring for safety.  03/05/23 1700  Psych Admission Type (Psych Patients Only)  Admission Status Involuntary  Psychosocial Assessment  Patient Complaints Anxiety  Eye Contact Fair  Facial Expression Animated  Affect Sad  Speech Logical/coherent  Interaction Assertive  Motor Activity Slow  Appearance/Hygiene Unremarkable  Behavior Characteristics Cooperative  Mood Pleasant  Thought Process  Coherency WDL  Content WDL  Delusions None reported or observed  Perception WDL  Hallucination None reported or observed  Judgment WDL  Confusion WDL  Danger to Self  Current suicidal ideation? Denies  Danger to Others  Danger to Others None reported or observed

## 2023-03-05 NOTE — H&P (Signed)
Psychiatric Admission Assessment Adult  Patient Identification: Paul Bowers MRN:  [id number] Date of Evaluation:  03/05/2023 Chief Complaint:  MDD (major depressive disorder), recurrent severe, without psychosis (HCC) [F33.2] Principal Diagnosis: MDD (major depressive disorder), recurrent severe, without psychosis (HCC) Diagnosis:  Principal Problem:   MDD (major depressive disorder), recurrent severe, without psychosis (HCC)  History of Present Illness: Paul Bowers is an 17 year old white male who was involuntarily committed by his parents for overdosing on Mucinex and Benadryl.  He goes to [facility] and sees a therapist and a Publishing rights manager who has him on a new medication called Anrelity.  He lives with his parents so he is not safe to be discharged home.  He tells me that his mother also goes to [facility].  He is also on Abilify, trazodone and propranolol.  Patient reports a history of depression with suicidal ideation, delusional thought, and hallucinations. Patient described previous delusional thought as feeling like he was in the [facility], stating that his mother and father were really not them. Patient states that he has experienced hallucinations consisting of hearing his mother calling him and seeing his father in his office, when he really was not present. Patient reports that he has not experienced delusional thought or hallucinations in a few days. Patient and his father also reports a family history of depression and suicide attempt with his mother.    Patient states that he also has a history of using Benadryl because "I liked the feeling", stating he started using Benadryl while enrolled at [employer]. Patient reports later having to call his parents and inform them of failing grades. Patient reports having to un-enroll from [employer]. Patient confirmed that delusional thought, hallucinations, and depression with suicidal ideations initiated prior to misuse of  OTC medications. Patient reports that his initial depression followed him being bullied in school, prior to college years.    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 Yes.    Has the patient been a risk to self in the past 6 months? Yes.    Has the patient been a risk to self within the distant past? Yes.    Is the patient a risk to others? No.  Has the patient been a risk to others in the past 6 months? No.  Has the patient been a risk to others within the distant past? No.   [place] Scale:  Flowsheet Row Admission (Current) from 03/04/2023 in [facility] INPATIENT BEHAVIORAL MEDICINE ED from 03/03/2023 in [facility] Emergency Department at [facility]  C-SSRS RISK CATEGORY No Risk No Risk        Prior Inpatient Therapy: Yes.   If yes, describe  Prior Outpatient Therapy: Yes.   If yes, describe   Alcohol Screening: Patient refused Alcohol Screening Tool:  (pt does not use etoh) 1. How often do you have a drink containing alcohol?: Never 2. How many drinks containing alcohol do you have on a typical day when you are drinking?: 1 or 2 3. How often do you have six or more drinks on one occasion?: Never AUDIT-C Score: 0 Alcohol Brief Interventions/Follow-up: Patient Refused Substance Abuse History in the last 12 months:  Yes.   Consequences of Substance Abuse:  Family Consequences:  As above Previous Psychotropic Medications: Yes  Psychological Evaluations: Yes  Past Medical History:  Past Medical History:  Diagnosis Date   ADHD    H/O self-harm 10/07/2016   Headache    Tourette's     Past Surgical History:  Procedure  Laterality Date   NO PAST SURGERIES     Family History:  Family History  Problem Relation Age of Onset   Kidney disease Mother    Diabetes Mother    Depression Mother    Breast cancer Maternal Grandmother    Family Psychiatric  History: Mom goes to [employer] [facility] Tobacco Screening:  Social History   Tobacco Use  Smoking Status Never  Smokeless Tobacco Never    BH Tobacco Counseling     Are you interested in Tobacco Cessation Medications?  No value filed. Counseled patient on smoking cessation:  N/A, patient does not use tobacco products Reason Tobacco Screening Not Completed: No value filed.       Social History:  Social History   Substance and Sexual Activity  Alcohol Use No     Social History   Substance and Sexual Activity  Drug Use No    Additional Social History: Marital status: Single Are you sexually active?: No Does patient have children?: No                         Allergies:  No Known Allergies Lab Results:  Results for orders placed or performed during the hospital encounter of 03/03/23 (from the past 48 hour(s))  Comprehensive metabolic panel     Status: Abnormal   Collection Time: 03/03/23  8:18 PM  Result Value Ref Range   Sodium 138 135 - 145 mmol/L   Potassium 3.8 3.5 - 5.1 mmol/L   Chloride 102 98 - 111 mmol/L   CO2 25 22 - 32 mmol/L   Glucose, Bld 71 70 - 99 mg/dL    Comment: Glucose reference range applies only to samples taken after fasting for at least 8 hours.   BUN 10 6 - 20 mg/dL   Creatinine, Ser 5.36 0.61 - 1.24 mg/dL   Calcium 9.3 8.9 - 64.4 mg/dL   Total Protein 8.2 (H) 6.5 - 8.1 g/dL   Albumin 4.8 3.5 - 5.0 g/dL   AST 26 15 - 41 U/L   ALT 29 0 - 44 U/L   Alkaline Phosphatase 117 38 - 126 U/L   Total Bilirubin 1.1 0.3 - 1.2 mg/dL   GFR, Estimated >03 >47 mL/min    Comment: (NOTE) Calculated using the CKD-EPI Creatinine Equation (2021)    Anion gap 11 5 - 15    Comment: Performed at [facility], [address] [place], [place] [address]  Ethanol     Status: None   Collection Time: 03/03/23  8:18 PM  Result Value Ref Range   Alcohol, Ethyl (B) <10 <10 mg/dL    Comment: (NOTE) Lowest detectable limit for serum alcohol is 10 mg/dL.  For medical purposes only. Performed at [facility], [address] [place], [place] [address]   Salicylate level     Status: Abnormal   Collection Time: 03/03/23  8:18 PM  Result Value Ref Range   Salicylate Lvl <7.0 (L) 7.0 - 30.0 mg/dL    Comment: Performed at [facility], [address] [place], [place] [address]  Acetaminophen level     Status: Abnormal   Collection Time: 03/03/23  8:18 PM  Result Value Ref Range   Acetaminophen (Tylenol), Serum <10 (L) 10 - 30 ug/mL    Comment: (NOTE) Therapeutic concentrations vary significantly. A range of 10-30 ug/mL  may be an effective concentration for many patients. However, some  are best treated at concentrations outside of this range. Acetaminophen concentrations >150 ug/mL at 4 hours after ingestion  and >50 ug/mL at 12 hours after ingestion are often associated with  toxic reactions.  Performed at [facility], [address] [place], [place] [address]   cbc     Status: None   Collection Time: 03/03/23  8:18 PM  Result Value Ref Range   WBC 8.6 4.0 - 10.5 K/uL   RBC 5.15 4.22 - 5.81 MIL/uL   Hemoglobin 15.2 13.0 - 17.0 g/dL   HCT 52.8 41.3 - 24.4 %   MCV 87.4 80.0 - 100.0 fL   MCH 29.5 26.0 - 34.0 pg   MCHC 33.8 30.0 - 36.0 g/dL   RDW 01.0 27.2 - 53.6 %   Platelets 303 150 - 400 K/uL   nRBC 0.0 0.0 - 0.2 %    Comment: Performed at [facility] [place], [address] [place], [place] [address]  CBG monitoring, ED     Status: None   Collection Time: 03/03/23  8:55 PM  Result Value Ref Range   Glucose-Capillary 73 70 - 99 mg/dL    Comment: Glucose reference range applies only to samples taken after fasting for at least 8 hours.  Urine Drug Screen,  Qualitative     Status: Abnormal   Collection Time: 03/03/23 10:20 PM  Result Value Ref Range   Tricyclic, Ur Screen POSITIVE (A) NONE DETECTED   Amphetamines, Ur Screen NONE DETECTED NONE DETECTED   MDMA (Ecstasy)Ur Screen NONE DETECTED NONE DETECTED   Cocaine Metabolite,Ur [place] NONE DETECTED NONE DETECTED   Opiate, Ur Screen POSITIVE (A) NONE DETECTED   Phencyclidine (PCP) Ur S NONE DETECTED NONE DETECTED   Cannabinoid 50 Ng, Ur [place] POSITIVE (A) NONE DETECTED   Barbiturates, Ur Screen NONE DETECTED NONE DETECTED   Benzodiazepine, Ur Scrn NONE DETECTED NONE DETECTED   Methadone Scn, Ur NONE DETECTED NONE DETECTED    Comment: (NOTE) Tricyclics + metabolites, urine    Cutoff 1000 ng/mL Amphetamines + metabolites, urine  Cutoff 1000 ng/mL MDMA (Ecstasy), urine              Cutoff 500 ng/mL Cocaine Metabolite, urine          Cutoff 300 ng/mL Opiate + metabolites, urine        Cutoff 300 ng/mL Phencyclidine (PCP), urine         Cutoff 25 ng/mL Cannabinoid, urine                 Cutoff 50 ng/mL Barbiturates + metabolites, urine  Cutoff 200 ng/mL Benzodiazepine, urine              Cutoff 200 ng/mL Methadone, urine                   Cutoff 300 ng/mL  The urine drug screen provides only a preliminary, unconfirmed analytical test result and should not be used for non-medical purposes. Clinical consideration and professional judgment should be applied to any positive drug screen result due to possible interfering substances. A more specific alternate chemical method must be used in order to obtain a confirmed analytical result. Gas chromatography / mass spectrometry (GC/MS) is the preferred confirm atory method. Performed at [facility], [address], [place], [place] [address]   Acetaminophen level     Status: Abnormal   Collection Time: 03/04/23  2:33 AM  Result Value Ref Range   Acetaminophen (Tylenol), Serum <10 (L) 10 - 30 ug/mL    Comment: (NOTE) Therapeutic  concentrations vary significantly. A range of 10-30 ug/mL  may be an effective concentration for many patients. However, some  are best treated at concentrations outside of this range. Acetaminophen concentrations >150 ug/mL at 4 hours after ingestion  and >50 ug/mL at 12 hours after ingestion are often associated with  toxic reactions.  Performed at [facility], [address] [place], [place] [address]   Salicylate level     Status: Abnormal   Collection Time: 03/04/23  2:33 AM  Result Value Ref Range   Salicylate Lvl <7.0 (L) 7.0 - 30.0 mg/dL    Comment: Performed at [facility], [address] [place], [place] [address]    Blood Alcohol level:  Lab Results  Component Value Date   [facility] <10 03/03/2023   ETH <5 06/28/2016    Metabolic Disorder Labs:  Lab Results  Component Value Date   HGBA1C 5.1 10/08/2016   MPG 100 10/08/2016   MPG 100 06/30/2016   No results found for: "PROLACTIN" Lab Results  Component Value Date   CHOL 160 10/08/2016   TRIG 95 10/08/2016   HDL 54 10/08/2016   CHOLHDL 3.0 10/08/2016   VLDL 19 10/08/2016   LDLCALC 87 10/08/2016   LDLCALC 106 (H) 06/30/2016    Current Medications: Current Facility-Administered Medications  Medication Dose Route Frequency Provider Last Rate Last Admin   acetaminophen (TYLENOL) tablet 650 mg  650 mg Oral Q6H PRN Jearld Lesch, NP       alum & mag hydroxide-simeth (MAALOX/MYLANTA) 200-200-20 MG/5ML suspension 30 mL  30 mL Oral Q4H PRN Dixon, Rashaun M, NP       diphenhydrAMINE (BENADRYL) capsule 50 mg  50 mg Oral TID PRN Jearld Lesch, NP       Or   diphenhydrAMINE (BENADRYL) injection 50 mg  50 mg Intramuscular TID PRN Jearld Lesch, NP       haloperidol (HALDOL) tablet 5 mg  5 mg Oral TID PRN Jearld Lesch, NP       Or   haloperidol lactate (HALDOL) injection 5 mg  5 mg Intramuscular TID PRN Jearld Lesch, NP       hydrOXYzine (ATARAX) tablet 25 mg  25 mg Oral TID PRN  Jearld Lesch, NP       LORazepam (ATIVAN) tablet 2 mg  2 mg Oral TID PRN Jearld Lesch, NP       Or   LORazepam (ATIVAN) injection 2 mg  2 mg Intramuscular TID PRN Jearld Lesch, NP       magnesium hydroxide (MILK OF MAGNESIA) suspension 30 mL  30 mL Oral Daily PRN Jearld Lesch, NP       traZODone (DESYREL) tablet 50 mg  50 mg Oral QHS PRN Jearld Lesch, NP   50 mg at 03/04/23 2140   PTA Medications: Medications Prior to Admission  Medication Sig Dispense Refill Last Dose   ARIPiprazole (ABILIFY) 10 MG tablet Take 1 tablet (10 mg total) by mouth daily. 30 tablet 2    Dextromethorphan-buPROPion ER (AUVELITY) 45-105 MG TBCR Take 1 tablet by mouth 2 (two) times daily. 60 tablet 2    propranolol (INDERAL) 20 MG tablet  Take 1 tablet (20 mg total) by mouth 3 (three) times daily. 90 tablet 2    traZODone (DESYREL) 50 MG tablet Take 1 tablet (50 mg total) by mouth at bedtime. 30 tablet 2     Musculoskeletal: Strength & Muscle Tone: within normal limits Gait & Station: normal Patient leans: N/A            Psychiatric Specialty Exam:  Presentation  General Appearance:  Appropriate for Environment; Casual  Eye Contact: Fair  Speech: Clear and Coherent  Speech Volume: Decreased  Handedness: Right   Mood and Affect  Mood: Anxious; Depressed; Dysphoric; Worthless  Affect: Depressed; Flat; Restricted   Thought Process  Thought Processes: Coherent  Duration of Psychotic Symptoms:N/A Past Diagnosis of Schizophrenia or Psychoactive disorder: No  Descriptions of Associations:Intact  Orientation:Full (Time, Place and Person)  Thought Content:WDL  Hallucinations:Hallucinations: Auditory Description of Auditory Hallucinations: drug induced  Ideas of Reference:None  Suicidal Thoughts:Suicidal Thoughts: Yes, Active SI Active Intent and/or Plan: With Plan; With Means to Carry Out; With Intent  Homicidal Thoughts:Homicidal Thoughts:  No   Sensorium  Memory: Immediate Fair; Remote Fair  Judgment: Impaired  Insight: Lacking   Executive Functions  Concentration: Fair  Attention Span: Fair  Recall: [employer] of Knowledge: Fair  Language: Fair   Psychomotor Activity  Psychomotor Activity: Psychomotor Activity: Normal   Assets  Assets: Manufacturing systems engineer; Financial Resources/Insurance; Housing; Physical Health; Vocational/Educational   Sleep  Sleep: Sleep: Fair    Physical Exam: Physical Exam Vitals and nursing note reviewed.  Constitutional:      Appearance: Normal appearance. He is normal weight.  HENT:     Head: Normocephalic and atraumatic.     Nose: Nose normal.     Mouth/Throat:     Pharynx: Oropharynx is clear.  Eyes:     Extraocular Movements: Extraocular movements intact.     Pupils: Pupils are equal, round, and reactive to light.  Cardiovascular:     Rate and Rhythm: Normal rate and regular rhythm.     Pulses: Normal pulses.     Heart sounds: Normal heart sounds.  Pulmonary:     Effort: Pulmonary effort is normal.     Breath sounds: Normal breath sounds.  Abdominal:     General: Abdomen is flat. Bowel sounds are normal.     Palpations: Abdomen is soft.  Musculoskeletal:        General: Normal range of motion.     Cervical back: Normal range of motion and neck supple.  Skin:    General: Skin is warm and dry.  Neurological:     General: No focal deficit present.     Mental Status: He is alert and oriented to person, place, and time.  Psychiatric:        Attention and Perception: Attention and perception normal.        Mood and Affect: Mood and affect normal.        Speech: Speech normal.        Behavior: Behavior normal. Behavior is cooperative.        Thought Content: Thought content normal.        Cognition and Memory: Cognition and memory normal.        Judgment: Judgment is impulsive and inappropriate.    Review of Systems  Constitutional: Negative.    HENT: Negative.    Eyes: Negative.   Respiratory: Negative.    Cardiovascular: Negative.   Gastrointestinal: Negative.   Genitourinary: Negative.   Musculoskeletal: Negative.  Skin: Negative.   Neurological: Negative.   Endo/Heme/Allergies: Negative.   Psychiatric/Behavioral: Negative.     Blood pressure (!) 107/50, pulse (!) 55, temperature 97.9 F (36.6 C), resp. rate 20, height 5\' 7"  (1.702 m), weight 70.3 kg, SpO2 99%. Body mass index is 24.28 kg/m.  Treatment Plan Summary: Daily contact with patient to assess and evaluate symptoms and progress in treatment, Medication management, and Plan restart home meds  Observation Level/Precautions:  15 minute checks  Laboratory:  CBC Chemistry Profile  Psychotherapy:    Medications:    Consultations:    Discharge Concerns:    Estimated LOS:  Other:     Physician Treatment Plan for Primary Diagnosis: MDD (major depressive disorder), recurrent severe, without psychosis (HCC) Long Term Goal(s): Improvement in symptoms so as ready for discharge  Short Term Goals: Ability to identify changes in lifestyle to reduce recurrence of condition will improve  Physician Treatment Plan for Secondary Diagnosis: Principal Problem:   MDD (major depressive disorder), recurrent severe, without psychosis (HCC)   I certify that inpatient services furnished can reasonably be expected to improve the patient's condition.    Sarina Ill, DO 8/4/20241:11 PM

## 2023-03-05 NOTE — Group Note (Signed)
Date:  03/05/2023 Time:  9:47 PM  Group Topic/Focus:   Wrap-Up Group:   The focus of this group is to help patients review their daily goal of treatment and discuss progress on daily workbooks.    Participation Level:  Active  Participation Quality:  Appropriate and Attentive  Affect:  Appropriate  Cognitive:  Appropriate  Insight: Appropriate and Good  Engagement in Group:  Supportive  Modes of Intervention:  Support  Additional Comments:     Belva Crome 03/05/2023, 9:47 PM

## 2023-03-06 DIAGNOSIS — F332 Major depressive disorder, recurrent severe without psychotic features: Secondary | ICD-10-CM | POA: Diagnosis not present

## 2023-03-06 NOTE — Progress Notes (Signed)
   03/06/23 2000  Psych Admission Type (Psych Patients Only)  Admission Status Involuntary  Psychosocial Assessment  Patient Complaints None  Eye Contact Fair  Facial Expression Animated  Affect Appropriate to circumstance  Speech Logical/coherent  Interaction Assertive  Motor Activity Slow  Appearance/Hygiene Unremarkable  Behavior Characteristics Cooperative  Mood Pleasant  Thought Process  Coherency WDL  Content WDL  Delusions None reported or observed  Perception WDL  Hallucination None reported or observed  Judgment Poor  Confusion None  Danger to Self  Current suicidal ideation? Denies  Danger to Others  Danger to Others None reported or observed

## 2023-03-06 NOTE — BHH Counselor (Signed)
CSW met with the patient at patients request.   Patient wanted update on why he has not be discharge.   CSW explained that this Clinical research associate is unsure at this time.  CSW reviewed with patient his H&P,  Per pt H&P is erroneous when it states that patient's parents IVCed him and that patient is not safe to return home.  Patient reports that he was [facility] by the ER physician due to being unable to communicate following taking the Mucinex and Benadryl.  Patient is adamant that this was not an overdose or plan to harm himself stating "I was just trying to get high".  Patient reports that both he and his parents would like for him to be discharged home.   Patient requested that CSW contact family and was provided the contact information for both mom and dad of patient.   CSW informed that CSW team will reach out to the patients parents, however, could not guarantee a discharge.  CSW also explained that this Clinical research associate or CSW team will let appropriate providers know of family's wishes.  Penni Homans, MSW, LCSW 03/06/2023 3:35 PM

## 2023-03-06 NOTE — BHH Counselor (Signed)
CSW contacted pt's mother, Briden Kammer ([phone]) and spoke with both parents (father Nida Boatman). They shared that they are confused as to why pt is still here in the hospital because they do not know what happened. They explained that they brought pt to the hospital because he was out of it and they were uncertain regarding what/how much he had taken. They expressed that they were just hoping to get pt's stomach pumped or something. The [place] were upset because they had been told by pt that he was being kept here because they IVC'd pt and did not want him to return home. CSW reviewed the chart and informed them that it was documented in a note that they were the ones who IVCd the pt. However, CSW also reviewed the paper copy of IVC which was completed by the ER provider. CSW relayed this information to the parents. They shared that they felt that pt's hospital stay is inappropriate. Parents shared that pt was not able to take his mental health medication while here until they were able to bring it to the hospital. They voiced concerns because they do not want this hospitalization to impact pt employment or registering for school. Pt was noted to supposed to be registering for classes today. They again voiced that pt was not suicidal and that he was just trying to get high. CSW validated the frustration but informed them that most of their issues would need to be addressed with the provider. They shared that they had asked to speak with the provider over the weekend but had still not heard anything. CSW shared that he would get their information to the provider to give them a call back. They agreed and thanked CSW for his time. No other concerns expressed. Contact ended without incident.   Vilma Meckel. Algis Greenhouse, MSW, LCSW, LCAS 03/06/2023 3:44 PM

## 2023-03-06 NOTE — Group Note (Signed)
Recreation Therapy Group Note   Group Topic:Coping Skills  Group Date: 03/06/2023 Start Time: 1000 End Time: 1100 Facilitators: Rosina Lowenstein, LRT, CTRS Location: Craft Room  Group Description: Mind Map.  Patient was provided a blank template of a diagram with 32 blank boxes in a tiered system, branching from the center (similar to a bubble chart). LRT directed patients to label the middle of the diagram "Coping Skills". LRT and patients then came up with 8 different coping skills as examples. Pt were directed to record their coping skills in the 2nd tier boxes closest to the center.  Patients would then share their coping skills with the group as LRT wrote them out. LRT gave a handout of 99 different coping skills at the end of group.    Goal Area(s) Addressed: Patients will be able to define "coping skills". Patient will identify new coping skills.  Patient will increase communication.   Affect/Mood: Appropriate and Flat   Participation Level: Active and Engaged   Participation Quality: Independent   Behavior: Calm and Cooperative   Speech/Thought Process: Coherent   Insight: Good   Judgement: Good   Modes of Intervention: Activity   Patient Response to Interventions:  Attentive, Engaged, Interested , and Receptive   Education Outcome:  Acknowledges education   Clinical Observations/Individualized Feedback: Paul Bowers was active in their participation of session activities and group discussion. Pt identified "driving and taking care of my fish" as coping skills. Pt interacted well with LRT and peers duration of session.    Plan: Continue to engage patient in RT group sessions 2-3x/week.   Rosina Lowenstein, LRT, CTRS 03/06/2023 11:39 AM

## 2023-03-06 NOTE — Progress Notes (Signed)
   03/06/23 0553  15 Minute Checks  Location Bedroom  Visual Appearance Calm  Behavior Sleeping  Sleep (Behavioral Health Patients Only)  Calculate sleep? (Click Yes once per 24 hr at 0600 safety check) Yes  Documented sleep last 24 hours 7.5

## 2023-03-06 NOTE — Progress Notes (Signed)
[facility] MD Progress Note  03/06/2023 10:48 AM STEPEHEN Bowers  MRN:  [id number] Subjective: Paul Bowers is seen on rounds.  His parents brought in his antidepressant which is a new form of dextromethorphan and Wellbutrin.  He says that he took the overdose to get high but he does have a history of suicidal ideation so he is not safe for discharge at this moment.  Besides, his parents are the ones that took out paperwork to have him in the hospital.  Continue observation.  Principal Problem: MDD (major depressive disorder), recurrent severe, without psychosis (HCC) Diagnosis: Principal Problem:   MDD (major depressive disorder), recurrent severe, without psychosis (HCC)  Total Time spent with patient: 15 minutes  Past Psychiatric History: He goes to [facility] outpatient  Past Medical History:  Past Medical History:  Diagnosis Date   ADHD    H/O self-harm 10/07/2016   Headache    Tourette's     Past Surgical History:  Procedure Laterality Date   NO PAST SURGERIES     Family History:  Family History  Problem Relation Age of Onset   Kidney disease Mother    Diabetes Mother    Depression Mother    Breast cancer Maternal Grandmother    Family Psychiatric  History: Mom comes across from Social History:  Social History   Substance and Sexual Activity  Alcohol Use No     Social History   Substance and Sexual Activity  Drug Use No    Social History   Socioeconomic History   Marital status: Single    Spouse name: Not on file   Number of children: Not on file   Years of education: Not on file   Highest education level: Not on file  Occupational History   Not on file  Tobacco Use   Smoking status: Never   Smokeless tobacco: Never  Vaping Use   Vaping status: Never Used  Substance and Sexual Activity   Alcohol use: No   Drug use: No   Sexual activity: Never  Other Topics Concern   Not on file  Social History Narrative   Not on file   Social Determinants of Health    Financial Resource Strain: Not on file  Food Insecurity: No Food Insecurity (03/04/2023)   Hunger Vital Sign    Worried About Running Out of Food in the Last Year: Never true    Ran Out of Food in the Last Year: Never true  Transportation Needs: No Transportation Needs (03/04/2023)   PRAPARE - Administrator, Civil Service (Medical): No    Lack of Transportation (Non-Medical): No  Physical Activity: Not on file  Stress: Not on file  Social Connections: Not on file   Additional Social History:                         Sleep: Good  Appetite:  Good  Current Medications: Current Facility-Administered Medications  Medication Dose Route Frequency Provider Last Rate Last Admin   acetaminophen (TYLENOL) tablet 650 mg  650 mg Oral Q6H PRN Dixon, Rashaun M, NP       alum & mag hydroxide-simeth (MAALOX/MYLANTA) 200-200-20 MG/5ML suspension 30 mL  30 mL Oral Q4H PRN Dixon, Rashaun M, NP       ARIPiprazole (ABILIFY) tablet 10 mg  10 mg Oral Daily Sarina Ill, DO   10 mg at 03/06/23 0834   Dextromethorphan-buPROPion ER 45-105 MG TBCR 1 tablet  1 tablet Oral  BID Sarina Ill, DO   1 tablet at 03/05/23 2155   diphenhydrAMINE (BENADRYL) capsule 50 mg  50 mg Oral TID PRN Jearld Lesch, NP       Or   diphenhydrAMINE (BENADRYL) injection 50 mg  50 mg Intramuscular TID PRN Jearld Lesch, NP       haloperidol (HALDOL) tablet 5 mg  5 mg Oral TID PRN Jearld Lesch, NP       Or   haloperidol lactate (HALDOL) injection 5 mg  5 mg Intramuscular TID PRN Jearld Lesch, NP       hydrOXYzine (ATARAX) tablet 25 mg  25 mg Oral TID PRN Jearld Lesch, NP       LORazepam (ATIVAN) tablet 2 mg  2 mg Oral TID PRN Jearld Lesch, NP       Or   LORazepam (ATIVAN) injection 2 mg  2 mg Intramuscular TID PRN Jearld Lesch, NP       magnesium hydroxide (MILK OF MAGNESIA) suspension 30 mL  30 mL Oral Daily PRN Jearld Lesch, NP       propranolol (INDERAL)  tablet 20 mg  20 mg Oral Q8H PRN Sarina Ill, DO   20 mg at 03/05/23 2134   traZODone (DESYREL) tablet 50 mg  50 mg Oral QHS PRN Jearld Lesch, NP   50 mg at 03/05/23 2135    Lab Results: No results found for this or any previous visit (from the past 48 hour(s)).  Blood Alcohol level:  Lab Results  Component Value Date   ETH <10 03/03/2023   ETH <5 06/28/2016    Metabolic Disorder Labs: Lab Results  Component Value Date   HGBA1C 5.1 10/08/2016   MPG 100 10/08/2016   MPG 100 06/30/2016   No results found for: "PROLACTIN" Lab Results  Component Value Date   CHOL 160 10/08/2016   TRIG 95 10/08/2016   HDL 54 10/08/2016   CHOLHDL 3.0 10/08/2016   VLDL 19 10/08/2016   LDLCALC 87 10/08/2016   LDLCALC 106 (H) 06/30/2016    Physical Findings: AIMS:  , ,  ,  ,    CIWA:    COWS:     Musculoskeletal: Strength & Muscle Tone: within normal limits Gait & Station: normal Patient leans: N/A  Psychiatric Specialty Exam:  Presentation  General Appearance:  Appropriate for Environment; Casual  Eye Contact: Fair  Speech: Clear and Coherent  Speech Volume: Decreased  Handedness: Right   Mood and Affect  Mood: Anxious; Depressed; Dysphoric; Worthless  Affect: Depressed; Flat; Restricted   Thought Process  Thought Processes: Coherent  Descriptions of Associations:Intact  Orientation:Full (Time, Place and Person)  Thought Content:WDL  History of Schizophrenia/Schizoaffective disorder:No  Duration of Psychotic Symptoms:Less than six months  Hallucinations:No data recorded Ideas of Reference:None  Suicidal Thoughts:No data recorded Homicidal Thoughts:No data recorded  Sensorium  Memory: Immediate Fair; Remote Fair  Judgment: Impaired  Insight: Lacking   Executive Functions  Concentration: Fair  Attention Span: Fair  Recall: [employer] of Knowledge: Fair  Language: Fair   Psychomotor Activity  Psychomotor  Activity:No data recorded  Assets  Assets: Communication Skills; Financial Resources/Insurance; Housing; Physical Health; Vocational/Educational   Sleep  Sleep:No data recorded   Blood pressure 109/81, pulse 75, temperature 97.6 F (36.4 C), temperature source Oral, resp. rate 18, height 5\' 7"  (1.702 m), weight 70.3 kg, SpO2 100%. Body mass index is 24.28 kg/m.   Treatment Plan Summary: Daily contact  with patient to assess and evaluate symptoms and progress in treatment, Medication management, and Plan continue current medications.  Sarina Ill, DO 03/06/2023, 10:48 AM

## 2023-03-06 NOTE — Group Note (Signed)
[facility] LCSW Group Therapy Note    Group Date: 03/06/2023 Start Time: 1330 End Time: 1430  Type of Therapy and Topic:  Group Therapy:  Overcoming Obstacles  Participation Level:  [facility] PARTICIPATION LEVEL: None  Mood:  Description of Group:   In this group patients will be encouraged to explore what they see as obstacles to their own wellness and recovery. They will be guided to discuss their thoughts, feelings, and behaviors related to these obstacles. The group will process together ways to cope with barriers, with attention given to specific choices patients can make. Each patient will be challenged to identify changes they are motivated to make in order to overcome their obstacles. This group will be process-oriented, with patients participating in exploration of their own experiences as well as giving and receiving support and challenge from other group members.  Therapeutic Goals: 1. Patient will identify personal and current obstacles as they relate to admission. 2. Patient will identify barriers that currently interfere with their wellness or overcoming obstacles.  3. Patient will identify feelings, thought process and behaviors related to these barriers. 4. Patient will identify two changes they are willing to make to overcome these obstacles:    Summary of Patient Progress Patient was present in group. Patient was alert throughout group, however, did not engage in group discussion.    Therapeutic Modalities:   Cognitive Behavioral Therapy Solution Focused Therapy Motivational Interviewing Relapse Prevention Therapy   Harden Mo, LCSW

## 2023-03-06 NOTE — Plan of Care (Signed)
Patient pleasant and cooperative on approach. Verbalized that his anxiety and depression getting better. Denies SI,HI and AVH. Patient states " getting settled " is his main stress. Patient appropriate with staff & peers. Compliant with medications. Appetite and energy level good. Support and encouragement given.

## 2023-03-06 NOTE — BH IP Treatment Plan (Signed)
Interdisciplinary Treatment and Diagnostic Plan Update  03/06/2023 Time of Session: 09:27 ZY HEDGLIN MRN: [id number]  Principal Diagnosis: MDD (major depressive disorder), recurrent severe, without psychosis (HCC)  Secondary Diagnoses: Principal Problem:   MDD (major depressive disorder), recurrent severe, without psychosis (HCC)   Current Medications:  Current Facility-Administered Medications  Medication Dose Route Frequency Provider Last Rate Last Admin   acetaminophen (TYLENOL) tablet 650 mg  650 mg Oral Q6H PRN Jearld Lesch, NP       alum & mag hydroxide-simeth (MAALOX/MYLANTA) 200-200-20 MG/5ML suspension 30 mL  30 mL Oral Q4H PRN Durwin Nora, Rashaun M, NP       ARIPiprazole (ABILIFY) tablet 10 mg  10 mg Oral Daily Sarina Ill, DO   10 mg at 03/06/23 2440   Dextromethorphan-buPROPion ER 45-105 MG TBCR 1 tablet  1 tablet Oral BID Sarina Ill, DO   1 tablet at 03/05/23 2155   diphenhydrAMINE (BENADRYL) capsule 50 mg  50 mg Oral TID PRN Jearld Lesch, NP       Or   diphenhydrAMINE (BENADRYL) injection 50 mg  50 mg Intramuscular TID PRN Jearld Lesch, NP       haloperidol (HALDOL) tablet 5 mg  5 mg Oral TID PRN Jearld Lesch, NP       Or   haloperidol lactate (HALDOL) injection 5 mg  5 mg Intramuscular TID PRN Jearld Lesch, NP       hydrOXYzine (ATARAX) tablet 25 mg  25 mg Oral TID PRN Jearld Lesch, NP       LORazepam (ATIVAN) tablet 2 mg  2 mg Oral TID PRN Jearld Lesch, NP       Or   LORazepam (ATIVAN) injection 2 mg  2 mg Intramuscular TID PRN Jearld Lesch, NP       magnesium hydroxide (MILK OF MAGNESIA) suspension 30 mL  30 mL Oral Daily PRN Durwin Nora, Rashaun M, NP       propranolol (INDERAL) tablet 20 mg  20 mg Oral Q8H PRN Sarina Ill, DO   20 mg at 03/05/23 2134   traZODone (DESYREL) tablet 50 mg  50 mg Oral QHS PRN Jearld Lesch, NP   50 mg at 03/05/23 2135   PTA Medications: Medications Prior to Admission   Medication Sig Dispense Refill Last Dose   Dextromethorphan-buPROPion ER (AUVELITY) 45-105 MG TBCR Take 1 tablet by mouth 2 (two) times daily. 60 tablet 2    ARIPiprazole (ABILIFY) 10 MG tablet Take 1 tablet (10 mg total) by mouth daily. 30 tablet 2    propranolol (INDERAL) 20 MG tablet Take 1 tablet (20 mg total) by mouth 3 (three) times daily. 90 tablet 2    traZODone (DESYREL) 50 MG tablet Take 1 tablet (50 mg total) by mouth at bedtime. 30 tablet 2     Patient Stressors: Substance abuse    Patient Strengths: Ability for insight  Active sense of humor  Average or above average intelligence  Capable of independent living  Communication skills  General fund of knowledge  Physical Health  Supportive family/friends   Treatment Modalities: Medication Management, Group therapy, Case management,  1 to 1 session with clinician, Psychoeducation, Recreational therapy.   Physician Treatment Plan for Primary Diagnosis: MDD (major depressive disorder), recurrent severe, without psychosis (HCC) Long Term Goal(s): Improvement in symptoms so as ready for discharge   Short Term Goals: Ability to identify changes in lifestyle to reduce recurrence of condition will improve  Medication Management: Evaluate patient's response, side effects, and tolerance of medication regimen.  Therapeutic Interventions: 1 to 1 sessions, Unit Group sessions and Medication administration.  Evaluation of Outcomes: Progressing  Physician Treatment Plan for Secondary Diagnosis: Principal Problem:   MDD (major depressive disorder), recurrent severe, without psychosis (HCC)  Long Term Goal(s): Improvement in symptoms so as ready for discharge   Short Term Goals: Ability to identify changes in lifestyle to reduce recurrence of condition will improve     Medication Management: Evaluate patient's response, side effects, and tolerance of medication regimen.  Therapeutic Interventions: 1 to 1 sessions, Unit Group  sessions and Medication administration.  Evaluation of Outcomes: Progressing   RN Treatment Plan for Primary Diagnosis: MDD (major depressive disorder), recurrent severe, without psychosis (HCC) Long Term Goal(s): Knowledge of disease and therapeutic regimen to maintain health will improve  Short Term Goals: Ability to remain free from injury will improve, Ability to verbalize frustration and anger appropriately will improve, Ability to demonstrate self-control, Ability to participate in decision making will improve, Ability to verbalize feelings will improve, Ability to disclose and discuss suicidal ideas, Ability to identify and develop effective coping behaviors will improve, and Compliance with prescribed medications will improve  Medication Management: RN will administer medications as ordered by provider, will assess and evaluate patient's response and provide education to patient for prescribed medication. RN will report any adverse and/or side effects to prescribing provider.  Therapeutic Interventions: 1 on 1 counseling sessions, Psychoeducation, Medication administration, Evaluate responses to treatment, Monitor vital signs and CBGs as ordered, Perform/monitor CIWA, COWS, AIMS and Fall Risk screenings as ordered, Perform wound care treatments as ordered.  Evaluation of Outcomes: Progressing   LCSW Treatment Plan for Primary Diagnosis: MDD (major depressive disorder), recurrent severe, without psychosis (HCC) Long Term Goal(s): Safe transition to appropriate next level of care at discharge, Engage patient in therapeutic group addressing interpersonal concerns.  Short Term Goals: Engage patient in aftercare planning with referrals and resources, Increase social support, Increase ability to appropriately verbalize feelings, Increase emotional regulation, Facilitate acceptance of mental health diagnosis and concerns, Facilitate patient progression through stages of change regarding  substance use diagnoses and concerns, Identify triggers associated with mental health/substance abuse issues, and Increase skills for wellness and recovery  Therapeutic Interventions: Assess for all discharge needs, 1 to 1 time with Social worker, Explore available resources and support systems, Assess for adequacy in community support network, Educate family and significant other(s) on suicide prevention, Complete Psychosocial Assessment, Interpersonal group therapy.  Evaluation of Outcomes: Progressing   Progress in Treatment: Attending groups: Yes. Participating in groups: Yes. Taking medication as prescribed: Yes. Toleration medication: Yes. Family/Significant other contact made: No, will contact:  if given permission. Patient understands diagnosis: Yes. Discussing patient identified problems/goals with staff: No. Medical problems stabilized or resolved: Yes. Denies suicidal/homicidal ideation: Yes. Issues/concerns per patient self-inventory: No. Other: none.  New problem(s) identified: No, Describe:  none identified.  New Short Term/Long Term Goal(s): detox, medication management for mood stabilization; elimination of SI thoughts; development of comprehensive mental wellness/sobriety plan.   Patient Goals:  "I don't really have a goal because I'm currently addressing my mental health in all the way I need to."  Discharge Plan or Barriers: CSW will assist pt with development of an appropriate aftercare/discharge plan.   Reason for Continuation of Hospitalization: Depression Medication stabilization  Estimated Length of Stay: 1-7 days  Last 3 [place] Suicide Severity Risk Score: Flowsheet Row Admission (Current) from 03/04/2023 in [facility] INPATIENT  BEHAVIORAL MEDICINE ED from 03/03/2023 in [facility] Emergency Department at [facility] [place]  C-SSRS RISK CATEGORY No Risk No Risk       Last PHQ 2/9 Scores:    01/25/2019    9:54 AM  Depression screen PHQ 2/9  Decreased  Interest 1  Down, Depressed, Hopeless 0  PHQ - 2 Score 1  Altered sleeping 1  Tired, decreased energy 1  Change in appetite 0  Feeling bad or failure about yourself  0  Trouble concentrating 0  Moving slowly or fidgety/restless 1  Suicidal thoughts 0  PHQ-9 Score 4  Difficult doing work/chores Not difficult at all    Scribe for Treatment Team: Glenis Smoker, LCSW 03/06/2023 9:51 AM

## 2023-03-06 NOTE — Group Note (Signed)
Date:  03/06/2023 Time:  10:01 AM  Group Topic/Focus:  OUTDOOR RECREATION AND RAPPORT BUILDING.    Participation Level:  Minimal  Participation Quality:  Appropriate  Affect:  Appropriate  Cognitive:  Alert and Appropriate  Insight: Limited  Engagement in Group:  Developing/Improving  Modes of Intervention:  Activity  Additional Comments:    Jawanza Zambito 03/06/2023, 10:01 AM

## 2023-03-06 NOTE — Progress Notes (Signed)
Pt shares that prior to admission, he had overdosed on benadryl and mucinex "to get high" and denies that this was a suicide attempt. Pt reports that he has overdosed on the same medications in the past as well in an attempt to get high, knock him out, and help calm his nerves. Encouraged pt to work on developing alternative coping skills that are healthy to manage his sleep and anxiety instead. Pt said that he was brought to the hospital after taking those medications by his parents because he had become "incoherent." He reports taking his antidepressant since February and feels that it has been effective. He shares that since taking his antidepressant it has been "easier to get up and do my hygiene" and his motivation has improved. He does report having a suicide attempt 4 years ago. He has been calm and cooperative on the unit. He has also been interacting with his peers. Pt denies SI/HI and AVH. Active listening, reassurance, and support provided. Q 15 min safety checks continue. Pt's safety has been maintained.   03/05/23 2135  Psych Admission Type (Psych Patients Only)  Admission Status Involuntary  Psychosocial Assessment  Patient Complaints Anxiety  Eye Contact Fair  Facial Expression Animated  Affect Appropriate to circumstance;Anxious  Speech Logical/coherent  Interaction Assertive  Motor Activity Other (Comment) (WNL)  Appearance/Hygiene Unremarkable  Behavior Characteristics Cooperative;Appropriate to situation;Anxious  Mood Anxious;Pleasant  Thought Process  Coherency WDL  Content WDL  Delusions None reported or observed  Perception WDL  Hallucination None reported or observed  Judgment Poor  Confusion None  Danger to Self  Current suicidal ideation? Denies  Danger to Others  Danger to Others None reported or observed

## 2023-03-06 NOTE — Group Note (Signed)
Date:  03/06/2023 Time:  11:04 PM  Group Topic/Focus:  Developing a Wellness Toolbox:   The focus of this group is to help patients develop a "wellness toolbox" with skills and strategies to promote recovery upon discharge.    Participation Level:  Active  Participation Quality:  Appropriate  Affect:  Appropriate  Cognitive:  Appropriate  Insight: Good  Engagement in Group:  Engaged  Modes of Intervention:  Discussion  Additional Comments: Lenore Cordia 03/06/2023, 11:04 PM

## 2023-03-07 NOTE — Progress Notes (Signed)
Suicide Risk Assessment  Discharge Assessment    [facility] Discharge Suicide Risk Assessment   Principal Problem: MDD (major depressive disorder), recurrent severe, without psychosis (HCC) Discharge Diagnoses: Principal Problem:   MDD (major depressive disorder), recurrent severe, without psychosis (HCC)  Total Time spent with patient: 30 minutes  Musculoskeletal: Strength & Muscle Tone: within normal limits Gait & Station: normal Patient leans: N/A  Psychiatric Specialty Exam  Presentation  General Appearance:  Appropriate for Environment; Casual  Eye Contact: Fair  Speech: Clear and Coherent; Normal Rate  Speech Volume: Normal  Handedness: Right  Mood and Affect  Mood: Euthymic  Duration of Depression Symptoms: Greater than two weeks  Affect: Blunt  Thought Process  Thought Processes: Coherent; Goal Directed; Linear  Descriptions of Associations:Intact  Orientation:Full (Time, Place and Person)  Thought Content:Logical  History of Schizophrenia/Schizoaffective disorder:No  Duration of Psychotic Symptoms:N/A  Ideas of Reference:None  Suicidal Thoughts:Suicidal Thoughts: No  Homicidal Thoughts:Homicidal Thoughts: No  Sensorium  Memory: Immediate Fair  Judgment: Fair  Insight: Fair   Art therapist  Concentration: Fair  Attention Span: Fair  Recall: [employer] of Knowledge: Fair  Language: Fair   Psychomotor Activity  Psychomotor Activity: Psychomotor Activity: Normal   Assets  Assets: Communication Skills; Desire for Improvement; Financial Resources/Insurance; Housing; Leisure Time; Physical Health; Resilience; Social Support; Vocational/Educational   Sleep  Sleep: Sleep: Fair  Physical Exam: Physical Exam see discharge ROS see discharge  Blood pressure 121/74, pulse 84, temperature 98.1 F (36.7 C), temperature source Oral, resp. rate 18, height 5\' 7"  (1.702 m), weight 70.3 kg, SpO2 99%. Body mass index is  24.28 kg/m.  Mental Status Per Nursing Assessment::   On Admission:  NA  Demographic Factors:  Male, Adolescent or young adult, and Caucasian  Loss Factors: NA  Historical Factors: Prior suicide attempts and Family history of mental illness or substance abuse  Risk Reduction Factors:   Sense of responsibility to family, Employed, Living with another person, especially a relative, Positive social support, Positive therapeutic relationship, and Positive coping skills or problem solving skills  Continued Clinical Symptoms:  Previous Psychiatric Diagnoses and Treatments  Cognitive Features That Contribute To Risk:  None    Suicide Risk:  Mild:  Suicidal ideation of limited frequency, intensity, duration, and specificity.  There are no identifiable plans, no associated intent, mild dysphoria and related symptoms, good self-control (both objective and subjective assessment), few other risk factors, and identifiable protective factors, including available and accessible social support.  Plan Of Care/Follow-up recommendations:  Follow up with outpatient psychiatry and counseling  Lauree Chandler, NP 03/07/2023, 10:28 AM

## 2023-03-07 NOTE — Plan of Care (Signed)
  Problem: Education: Goal: Knowledge of [facility] Education information/materials will improve Outcome: Progressing   Problem: Activity: Goal: Interest or engagement in activities will improve Outcome: Progressing   Problem: Coping: Goal: Ability to verbalize frustrations and anger appropriately will improve Outcome: Progressing   

## 2023-03-07 NOTE — Discharge Summary (Signed)
Physician Discharge Summary Note  Patient:  Paul Bowers is an 17 y.o., male MRN:  [id number] DOB:  2006/03/24 Patient phone:  [phone] (home)  Patient address:   [address] [place] [place] [address],  Total Time spent with patient: 30 minutes  Date of Admission:  03/04/2023 Date of Discharge: 03/07/2023  Reason for Admission: 17 y/o male w/ reported history of MDD, GAD, ADHD, admitted to inpatient psychiatry for overdose on benadryl and mucinex.  Principal Problem: MDD (major depressive disorder), recurrent severe, without psychosis (HCC) Discharge Diagnoses: Principal Problem:   MDD (major depressive disorder), recurrent severe, without psychosis (HCC)  Subjective: Pt chart reviewed, seen on rounds, and discussed with interdisciplinary team. Pt endorses euthymic mood. Denies suicidal, homicidal ideations. Denies auditory visual hallucinations or paranoia. Does not appear to be responding to internal stimuli. No evidence of agitation, aggression or distractibility. No paranoia or delusions elicited. Continues to maintain that overdose was accidental and not as suicide attempt. He verbalizes readiness for discharge. States he is followed by outpatient psychiatry and counseling and intends to follow up for continued treatment. Pt gives verbal consent to speak with his mother and father, provides their names and phone numbers, Velna Hatchet [phone], Nida Boatman [phone]. Attempted call with Velna Hatchet and was sent to voicemail. Spoke with Nida Boatman and Velna Hatchet was present at the time. Nida Boatman states pt was brought to the ED by him and his wife after receiving text from his other children that pt was acting bizarre with suspicion for substance use. They brought him to the ED due to concerns about how much pt had used. They state they have no safety concerns with pt returning home today and would like him discharged. They are frustrated that pt has been admitted. They state pt is well connected with outpatient  behavioral services. They state pt needs to go to work and enroll in school. Pt lives with them. They are able to monitor and maintain safety for pt. They state they have already gone through the house and pt's room to remove anything they think is a safety concern. Safety planning completed, includint: Frequent conversations regarding unsafe thoughts. Locking/monitoring the use of all significant sharps, including knives, razor blades, pencil sharpener razors. If there is a firearm in the home, keeping the firearm unloaded, locking the firearm, locking the ammunition separately from the firearm, preventing access to the firearm and the ammunition. Locking/monitoring the use of medications, including over-the-counter medications and supplements. Having a responsible person dispense medications until patient has strengthened coping skills. Room checks for sharps or other harmful objects. Secure all chemical substances that can be ingested or inhaled. Securing any ligature risks. Calling 911/EMS or going to the nearest emergency room for any worsening of condition.  Past Psychiatric History: Reported history of MDD, GAD, ADHD  Past Medical History:  Past Medical History:  Diagnosis Date   ADHD    H/O self-harm 10/07/2016   Headache    Tourette's     Past Surgical History:  Procedure Laterality Date   NO PAST SURGERIES     Family History:  Family History  Problem Relation Age of Onset   Kidney disease Mother    Diabetes Mother    Depression Mother    Breast cancer Maternal Grandmother    Family Psychiatric  History: See above Social History:  Social History   Substance and Sexual Activity  Alcohol Use No     Social History   Substance and Sexual Activity  Drug Use No  Social History   Socioeconomic History   Marital status: Single    Spouse name: Not on file   Number of children: Not on file   Years of education: Not on file   Highest education level: Not on file   Occupational History   Not on file  Tobacco Use   Smoking status: Never   Smokeless tobacco: Never  Vaping Use   Vaping status: Never Used  Substance and Sexual Activity   Alcohol use: No   Drug use: No   Sexual activity: Never  Other Topics Concern   Not on file  Social History Narrative   Not on file   Social Determinants of Health   Financial Resource Strain: Not on file  Food Insecurity: No Food Insecurity (03/04/2023)   Hunger Vital Sign    Worried About Running Out of Food in the Last Year: Never true    Ran Out of Food in the Last Year: Never true  Transportation Needs: No Transportation Needs (03/04/2023)   PRAPARE - Administrator, Civil Service (Medical): No    Lack of Transportation (Non-Medical): No  Physical Activity: Not on file  Stress: Not on file  Social Connections: Not on file   Hospital Course:  Paul Bowers was admitted for MDD (major depressive disorder), recurrent severe, without psychosis (HCC) and crisis management.  Paul Bowers was discharged with current medication and was instructed on how to take medications as prescribed; (details listed below under Medication List). Safety planning completed with patient's mother and father prior to discharge. Upon completion of this admission the [facility] was both mentally and medically stable for discharge denying suicidal/homicidal ideation, auditory/visual/tactile hallucinations, delusional thoughts and paranoia.     Physical Findings: AIMS:  , ,  ,  ,    CIWA:    COWS:     Musculoskeletal: Strength & Muscle Tone: within normal limits Gait & Station: normal Patient leans: N/A   Psychiatric Specialty Exam:  Presentation  General Appearance:  Appropriate for Environment; Casual  Eye Contact: Fair  Speech: Clear and Coherent; Normal Rate  Speech Volume: Normal  Handedness: Right   Mood and Affect  Mood: Euthymic  Affect: Blunt   Thought Process   Thought Processes: Coherent; Goal Directed; Linear  Descriptions of Associations:Intact  Orientation:Full (Time, Place and Person)  Thought Content:Logical  History of Schizophrenia/Schizoaffective disorder:No  Duration of Psychotic Symptoms:N/A  Ideas of Reference:None  Suicidal Thoughts:Suicidal Thoughts: No  Homicidal Thoughts:Homicidal Thoughts: No   Sensorium  Memory: Immediate Fair  Judgment: Fair  Insight: Fair   Art therapist  Concentration: Fair  Attention Span: Fair  Recall: [employer] of Knowledge: Fair  Language: Fair   Psychomotor Activity  Psychomotor Activity: Psychomotor Activity: Normal   Assets  Assets: Communication Skills; Desire for Improvement; Financial Resources/Insurance; Housing; Leisure Time; Physical Health; Resilience; Social Support; Vocational/Educational   Sleep  Sleep: Sleep: Fair  Physical Exam: Physical Exam Constitutional:      General: He is not in acute distress.    Appearance: He is not ill-appearing, toxic-appearing or diaphoretic.  Cardiovascular:     Rate and Rhythm: Normal rate.  Pulmonary:     Effort: Pulmonary effort is normal. No respiratory distress.  Neurological:     Mental Status: He is alert and oriented to person, place, and time.  Psychiatric:        Attention and Perception: Attention and perception normal.        Mood and  Affect: Mood normal. Affect is blunt.        Speech: Speech normal.        Behavior: Behavior normal. Behavior is cooperative.        Thought Content: Thought content normal.        Cognition and Memory: Cognition and memory normal.        Judgment: Judgment normal.    Review of Systems  Constitutional:  Negative for chills and fever.  Respiratory:  Negative for shortness of breath.   Cardiovascular:  Negative for chest pain and palpitations.  Gastrointestinal:  Negative for abdominal pain.  Neurological:  Negative for headaches.   Blood pressure  121/74, pulse 84, temperature 98.1 F (36.7 C), temperature source Oral, resp. rate 18, height 5\' 7"  (1.702 m), weight 70.3 kg, SpO2 99%. Body mass index is 24.28 kg/m.   Social History   Tobacco Use  Smoking Status Never  Smokeless Tobacco Never   Tobacco Cessation:  N/A, patient does not currently use tobacco products   Blood Alcohol level:  Lab Results  Component Value Date   ETH <10 03/03/2023   ETH <5 06/28/2016    Metabolic Disorder Labs:  Lab Results  Component Value Date   HGBA1C 5.1 10/08/2016   MPG 100 10/08/2016   MPG 100 06/30/2016   No results found for: "PROLACTIN" Lab Results  Component Value Date   CHOL 160 10/08/2016   TRIG 95 10/08/2016   HDL 54 10/08/2016   CHOLHDL 3.0 10/08/2016   VLDL 19 10/08/2016   LDLCALC 87 10/08/2016   LDLCALC 106 (H) 06/30/2016    See Psychiatric Specialty Exam and Suicide Risk Assessment completed by Attending Physician prior to discharge.  Discharge destination:  Home  Is patient on multiple antipsychotic therapies at discharge:  No   Has Patient had three or more failed trials of antipsychotic monotherapy by history:  No  Recommended Plan for Multiple Antipsychotic Therapies: NA   Allergies as of 03/07/2023   No Known Allergies      Medication List     STOP taking these medications    acetaminophen-codeine 300-30 MG tablet Commonly known as: TYLENOL #3   Sodium Fluoride 5000 PPM 1.1 % Pste Generic drug: Sodium Fluoride       TAKE these medications      Indication  ARIPiprazole 10 MG tablet Commonly known as: ABILIFY Take 1 tablet (10 mg total) by mouth daily.  Indication: Delusions   Auvelity 45-105 MG Tbcr Generic drug: Dextromethorphan-buPROPion ER Take 1 tablet by mouth 2 (two) times daily.  Indication: Major Depressive Disorder   propranolol 20 MG tablet Commonly known as: INDERAL Take 1 tablet (20 mg total) by mouth 3 (three) times daily.  Indication: Feeling Anxious   traZODone  50 MG tablet Commonly known as: DESYREL Take 1 tablet (50 mg total) by mouth at bedtime.  Indication: Trouble Sleeping        Follow-up Information     [facility] Follow up.   Why: You've elected to schedule your own appointment.  Please reach out to schedule at your earliest convenience Contact information: [address] [place] [address]        [facility] Follow up.   Why: You've elected to schedule your own appointment.  Please reach out to schedule at your earliest convenience Contact information: [address] [place] [address] [phone]         [facility] Follow up.  Why: New address is [address] [place], [place] [address].  Walk ins are Monday, Wednesday and Friday 8AM to [facility] Contact information: [address] [place] [address] [phone]         [facility] .   Contact information: [address] [place] [address] [phone]                Follow-up recommendations:   Follow up with outpatient psychiatry and counseling  Signed: Lauree Chandler, NP 03/07/2023, 12:09 PM

## 2023-03-07 NOTE — Progress Notes (Signed)
  [facility] Adult Case Management Discharge Plan :  Will you be returning to the same living situation after discharge:  Yes,  pt reports that he is returning home.  At discharge, do you have transportation home?: Yes,  pt reports that parents will provide transportation. Do you have the ability to pay for your medications: Yes,  [facility]  Release of information consent forms completed and in the chart;  Patient's signature needed at discharge.  Patient to Follow up at:  Follow-up Information     [facility] Follow up.   Why: You've elected to schedule your own appointment.  Please reach out to schedule at your earliest convenience Contact information: [address] [place] [address]        [facility] Follow up.   Why: You've elected to schedule your own appointment.  Please reach out to schedule at your earliest convenience Contact information: [address] [place] [address] [phone]         [facility] Follow up.   Why: New address is [address] [place], [place] [address].  Walk ins are Monday, Wednesday and Friday 8AM to [facility] Contact information: [address] [place] [address] [phone]         [facility] .   Contact information: [address] [place] [address] [phone]                 Next level of care provider has access to [facility] Link:no  Safety Planning and Suicide Prevention discussed: Yes,  SPE completed with the patient.  Patient declined collateral contact.      Has patient been referred to the Quitline?: Patient refused referral for treatment  Patient has been referred for addiction treatment: Patient refused referral for treatment.  Harden Mo, LCSW 03/07/2023, 10:32 AM

## 2023-03-07 NOTE — Plan of Care (Signed)

## 2023-03-07 NOTE — Group Note (Signed)
Recreation Therapy Group Note   Group Topic:Self-Esteem  Group Date: 03/07/2023 Start Time: 1005 End Time: 1105 Facilitators: Rosina Lowenstein, LRT, CTRS Location:  Craft Room   Group Description: My strengths and Qualities. Patients and LRT discussed the importance of self-love/self-esteem and things that cause it to fluctuate. Pt completed a worksheet that helps them identify 24 different strengths and qualities about themselves. Pt encouraged to read aloud at least 3 off their sheet to the group. LRT and pts discussed how this can be applied to daily life post-discharge.  Pt's then played "Positive Affirmation Bingo" afterwards, with stress balls as prizes.   Goal Area(s) Addressed: Patient will identify positive qualities about themselves. Patient will learn new positive affirmations.  Patient will recite positive qualities and affirmations aloud to the group.  Patient will increase communication.   Affect/Mood: N/A   Participation Level: Did not attend    Clinical Observations/Individualized Feedback: Paul Bowers did not attend group.  Plan: Continue to engage patient in RT group sessions 2-3x/week.   Rosina Lowenstein, LRT, CTRS 03/07/2023 11:59 AM

## 2023-03-08 ENCOUNTER — Telehealth: Payer: Self-pay

## 2023-03-08 NOTE — Transitions of Care (Post Inpatient/ED Visit) (Signed)
   03/08/2023  Name: Paul Bowers MRN: [id number] DOB: [birth year]  Today's TOC FU Call Status: Today's TOC FU Call Status:: Successful TOC FU Call Completed TOC FU Call Complete Date: 03/08/23  Transition Care Management Follow-up Telephone Call Date of Discharge: 03/07/23 Discharge Facility: [facility]) Type of Discharge: Inpatient Admission Primary Inpatient Discharge Diagnosis:: Major depressive disorder, recurrent severe without psychotic features How have you been since you were released from the hospital?: Better Any questions or concerns?: No  Items Reviewed: Did you receive and understand the discharge instructions provided?: Yes Medications obtained,verified, and reconciled?: Yes (Medications Reviewed) Any new allergies since your discharge?: No Dietary orders reviewed?: NA Do you have support at home?: Yes People in Home: parent(s)  Medications Reviewed Today: Medications Reviewed Today   Medications were not reviewed in this encounter     Home Care and Equipment/Supplies: Were Home Health Services Ordered?: No Any new equipment or medical supplies ordered?: No  Functional Questionnaire: Do you need assistance with bathing/showering or dressing?: No Do you need assistance with meal preparation?: No Do you need assistance with eating?: No Do you have difficulty maintaining continence: No Do you need assistance with getting out of bed/getting out of a chair/moving?: No Do you have difficulty managing or taking your medications?: No  Follow up appointments reviewed: PCP Follow-up appointment confirmed?: NA Specialist Hospital Follow-up appointment confirmed?: Yes Date of Specialist follow-up appointment?: 03/23/23 Do you need transportation to your follow-up appointment?: No Do you understand care options if your condition(s) worsen?: Yes-patient verbalized understanding    SIGNATURE TB,CMA

## 2023-03-23 ENCOUNTER — Ambulatory Visit (INDEPENDENT_AMBULATORY_CARE_PROVIDER_SITE_OTHER): Payer: Commercial Managed Care - PPO | Admitting: Adult Health

## 2023-03-23 ENCOUNTER — Encounter: Payer: Self-pay | Admitting: Adult Health

## 2023-03-23 DIAGNOSIS — F902 Attention-deficit hyperactivity disorder, combined type: Secondary | ICD-10-CM | POA: Diagnosis not present

## 2023-03-23 DIAGNOSIS — F3341 Major depressive disorder, recurrent, in partial remission: Secondary | ICD-10-CM | POA: Diagnosis not present

## 2023-03-23 DIAGNOSIS — F411 Generalized anxiety disorder: Secondary | ICD-10-CM | POA: Diagnosis not present

## 2023-03-23 DIAGNOSIS — G4700 Insomnia, unspecified: Secondary | ICD-10-CM

## 2023-03-23 NOTE — Progress Notes (Signed)
Paul Bowers [id number] April 09, 2006 16 y.o.  Subjective:   Patient ID:  Paul Bowers is a 17 y.o. (DOB 2006-03-05) male.  Chief Complaint: No chief complaint on file.   HPI Paul Bowers presents to the office today for follow-up of ADHD, GAD, insomnia, paranoia, and Tourette's.  Reports recent hospital stay - collateral reviewed.  Describes mood today as "ok". Pleasant. Mood symptoms - reports decreased depression - "on occasion - kinda spotty". Reports anxiety - "a little". Reports social anxiety. Denies irritability. Reports some worry, rumination, and over thinking. Reports decreased obsessive thoughts, denies acts. Reports decreased paranoia - "some moments". Mood has improved - "more stable". Stating "I'm feel like I'm doing better for the most part". Feels like current medications are helpful. Improved interest and motivation. Taking medications as prescribed.  Energy levels stable. Active, does not have a regular exercise routine.  Enjoys some usual interests and activities. Single. Not dating. Lives with mother and father - has 4 siblings - 2 boston terriers - Paul Bowers and Paul Bowers and Paul Bowers. Spending time with family.  Appetite adequate. Weight stable - 150 to 155 pounds - 68". Sleep has improved. Averages 8 to 9 hours. Focus and concentration stable. Completing tasks. Managing aspects of household. Recently returned to school at  [employer]. Working part time. Denies SI or HI.  Reports AH or VH - comes and goes.  Reports paranoia. Denies self harm. Denies recent substance use.    AIMS    Flowsheet Row [facility]Dis[facility] Admission (Discharged) from OP [facility]  AIMS Total Score 0 0      PHQ2-9    Flowsheet Row Office Visit from 01/25/2019 in [facility]  PHQ-2 Total Score 1  PHQ-9 Total  Score 4      Flowsheet Row Admission (Discharged) from 03/04/2023 in [facility] ED from 03/03/2023 in [facility] Department [facility]  C-SSRS RISK CATEGORY No Risk No Risk        Review of Systems:  Review of Systems  Musculoskeletal:  Negative for gait problem.  Neurological:  Negative for tremors.  Psychiatric/Behavioral:         Please refer to HPI    Medications: I have reviewed the patient's current medications.  Current Outpatient Medications  Medication Sig Dispense Refill   ARIPiprazole (ABILIFY) 10 MG tablet Take 1 tablet (10 mg total) by mouth daily. 30 tablet 2   Dextromethorphan-buPROPion ER (AUVELITY) 45-105 MG TBCR Take 1 tablet by mouth 2 (two) times daily. 60 tablet 2   propranolol (INDERAL) 20 MG tablet Take 1 tablet (20 mg total) by mouth 3 (three) times daily. 90 tablet 2   traZODone (DESYREL) 50 MG tablet Take 1 tablet (50 mg total) by mouth at bedtime. 30 tablet 2   No current facility-administered medications for this visit.    Medication Side Effects: None  Allergies: No Known Allergies  Past Medical History:  Diagnosis Date   ADHD    H/O self-harm 10/07/2016   Headache    Tourette's     Past Medical History, Surgical history, Social history, and Family history were reviewed and updated as appropriate.   Please see review of systems for further details on the patient's review from today.   Objective:   Physical Exam:  There were no vitals taken for this visit.  Physical Exam Constitutional:  General: He is not in acute distress. Musculoskeletal:        General: No deformity.  Neurological:     Mental Status: He is alert and oriented to person, place, and time.     Coordination: Coordination normal.  Psychiatric:        Attention and Perception: Attention and perception normal. He does not perceive auditory or visual hallucinations.        Mood and Affect: Mood normal. Mood is not anxious or  depressed. Affect is not labile, blunt, angry or inappropriate.        Speech: Speech normal.        Behavior: Behavior normal.        Thought Content: Thought content normal. Thought content is not paranoid or delusional. Thought content does not include homicidal or suicidal ideation. Thought content does not include homicidal or suicidal plan.        Cognition and Memory: Cognition and memory normal.        Judgment: Judgment normal.     Comments: Insight intact     Lab Review:     Component Value Date/Time   NA 138 03/03/2023 2018   K 3.8 03/03/2023 2018   CL 102 03/03/2023 2018   CO2 25 03/03/2023 2018   GLUCOSE 71 03/03/2023 2018   BUN 10 03/03/2023 2018   CREATININE 1.17 03/03/2023 2018   CALCIUM 9.3 03/03/2023 2018   PROT 8.2 (H) 03/03/2023 2018   ALBUMIN 4.8 03/03/2023 2018   AST 26 03/03/2023 2018   ALT 29 03/03/2023 2018   ALKPHOS 117 03/03/2023 2018   BILITOT 1.1 03/03/2023 2018   GFRNONAA >60 03/03/2023 2018   GFRAA NOT CALCULATED 05/29/2017 1507       Component Value Date/Time   WBC 8.6 03/03/2023 2018   RBC 5.15 03/03/2023 2018   HGB 15.2 03/03/2023 2018   HCT 45.0 03/03/2023 2018   PLT 303 03/03/2023 2018   MCV 87.4 03/03/2023 2018   MCH 29.5 03/03/2023 2018   MCHC 33.8 03/03/2023 2018   RDW 11.9 03/03/2023 2018   LYMPHSABS 2.3 06/28/2016 1240   MONOABS 0.6 06/28/2016 1240   EOSABS 0.0 06/28/2016 1240   BASOSABS 0.0 06/28/2016 1240    No results found for: "POCLITH", "LITHIUM"   No results found for: "PHENYTOIN", "PHENOBARB", "VALPROATE", "CBMZ"   .res Assessment: Plan:    Plan:  PDMP reviewed  Abilify 10mg  daily Trazadone 50mg  at hs  Auvelity 75mg  BID - send to [facility] Propranolol 20mg  TID  RTC 4 weeks  Seeing therapist - Marisa Cyphers - [facility].  Patient advised to contact office with any questions, adverse effects, or acute worsening in signs and symptoms.  Discussed potential metabolic side effects associated with atypical  antipsychotics, as well as potential risk for movement side effects. Advised pt to contact office if movement side effects occur.    Diagnoses and all orders for this visit:  Depression, major, recurrent, in partial remission (HCC)  Attention deficit hyperactivity disorder, combined type, moderate  Generalized anxiety disorder  Insomnia, unspecified type     Please see After Visit Summary for patient specific instructions.  Future Appointments  Date Time Provider Department Center  04/21/2023 10:40 AM Karma Hiney, Thereasa Solo, NP [facility] None    No orders of the defined types were placed in this encounter.   -------------------------------

## 2023-03-27 ENCOUNTER — Telehealth: Payer: Self-pay | Admitting: Adult Health

## 2023-03-27 NOTE — Telephone Encounter (Addendum)
Mom states patient got samples of Auvelity and hid them. In an attempt to get high he took 1 whole bottle and part of another. They did not take to ER, pushed fluids and let him "sleep it off." Mom also reporting he has not taken Abilify in approximately one month. Mom asking what to do.   Discussed with Almira Coaster. Told mom he needed to go to ER for evaluation and then needs to go inpatient, likely [facility]. Patient is in [place]. I recommended local ER or [facility] ER due to the seriousness. Mom said he was "completely sober now". I told her it was a serious OD and he needed medication evaluation and then determination could be made where to go from there.

## 2023-03-27 NOTE — Telephone Encounter (Signed)
Next visit is 04/21/23. Velna Hatchet, mom called regarding Vaun's medications. Please call her at [phone]. Velna Hatchet is listed on the DPR to speak to.

## 2023-03-28 ENCOUNTER — Telehealth: Payer: Self-pay | Admitting: Adult Health

## 2023-03-28 NOTE — Telephone Encounter (Signed)
Had talked with mom yesterday a couple of times. Called back to make sure nothing else was needed. She said they did not take patient to the ER, but were looking into a rehab facility, just waiting on phone calls.

## 2023-03-28 NOTE — Telephone Encounter (Signed)
Pt's mom, Velna Hatchet, LVM 8/26 @ 4:40p.  She is on DPR.  She said she couldn't hear when she called in earlier (I don' t know who she spoke to)  She said they are seeking outside help.  She said she didn't think [facility] had been forthcoming with Almira Coaster that he has not been taking his Abilifi.  And she said they were looking for his Auvelity that was stashed in his room.  I see previous note from when she called earlier in the day. I don't know if she wants another call back or not.  Next appt 9/20

## 2023-04-07 ENCOUNTER — Telehealth: Payer: Self-pay | Admitting: Adult Health

## 2023-04-07 NOTE — Telephone Encounter (Signed)
PA request received from [facility] for [facility] ER tabs, sent form and CMM key

## 2023-04-10 NOTE — Telephone Encounter (Signed)
Noted thanks °

## 2023-04-10 NOTE — Telephone Encounter (Signed)
I wasn't aware pt was going to continue this medication since the overdose? Just let me know

## 2023-04-21 ENCOUNTER — Ambulatory Visit: Payer: Commercial Managed Care - PPO | Admitting: Adult Health

## 2023-05-19 ENCOUNTER — Encounter: Payer: Self-pay | Admitting: Adult Health
